# Patient Record
Sex: Male | Born: 1995 | Race: Black or African American | Hispanic: No | Marital: Single
Health system: Southern US, Community
[De-identification: ages and names within clinical notes are randomized; demographics above are authoritative.]

## PROBLEM LIST (undated history)

## (undated) DIAGNOSIS — K920 Hematemesis: Secondary | ICD-10-CM

## (undated) DIAGNOSIS — R569 Unspecified convulsions: Secondary | ICD-10-CM

## (undated) DIAGNOSIS — R109 Unspecified abdominal pain: Secondary | ICD-10-CM

## (undated) DIAGNOSIS — T7840XA Allergy, unspecified, initial encounter: Secondary | ICD-10-CM

## (undated) DIAGNOSIS — F1910 Other psychoactive substance abuse, uncomplicated: Secondary | ICD-10-CM

## (undated) DIAGNOSIS — F419 Anxiety disorder, unspecified: Secondary | ICD-10-CM

## (undated) HISTORY — PX: KNEE SURGERY: SHX244

## (undated) HISTORY — DX: Unspecified abdominal pain: R10.9

## (undated) HISTORY — DX: Hematemesis: K92.0

---
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
## 2015-08-01 DIAGNOSIS — F410 Panic disorder [episodic paroxysmal anxiety] without agoraphobia: Secondary | ICD-10-CM | POA: Diagnosis not present

## 2015-08-01 DIAGNOSIS — F331 Major depressive disorder, recurrent, moderate: Secondary | ICD-10-CM | POA: Diagnosis not present

## 2015-10-29 DIAGNOSIS — F410 Panic disorder [episodic paroxysmal anxiety] without agoraphobia: Secondary | ICD-10-CM | POA: Diagnosis not present

## 2015-10-29 DIAGNOSIS — F331 Major depressive disorder, recurrent, moderate: Secondary | ICD-10-CM | POA: Diagnosis not present

## 2015-12-05 ENCOUNTER — Emergency Department
Admission: EM | Admit: 2015-12-05 | Discharge: 2015-12-05 | Disposition: A | Discharge status: Home / Self Care | Payer: PRIVATE HEALTH INSURANCE

## 2015-12-05 ENCOUNTER — Inpatient Hospital Stay: Admit: 2015-12-05 | Payer: Self-pay

## 2015-12-05 ENCOUNTER — Encounter: Payer: Self-pay | Admitting: *Deleted

## 2015-12-05 ENCOUNTER — Emergency Department

## 2015-12-05 ENCOUNTER — Emergency Department
Admission: EM | Admit: 2015-12-05 | Discharge: 2015-12-06 | Attending: Emergency Medicine | Admitting: Emergency Medicine

## 2015-12-05 DIAGNOSIS — Y939 Activity, unspecified: Secondary | ICD-10-CM | POA: Diagnosis not present

## 2015-12-05 DIAGNOSIS — F0781 Postconcussional syndrome: Secondary | ICD-10-CM | POA: Insufficient documentation

## 2015-12-05 DIAGNOSIS — Y999 Unspecified external cause status: Secondary | ICD-10-CM | POA: Insufficient documentation

## 2015-12-05 DIAGNOSIS — S0990XA Unspecified injury of head, initial encounter: Secondary | ICD-10-CM | POA: Diagnosis present

## 2015-12-05 DIAGNOSIS — Y92149 Unspecified place in prison as the place of occurrence of the external cause: Secondary | ICD-10-CM | POA: Insufficient documentation

## 2015-12-05 DIAGNOSIS — W050XXA Fall from non-moving wheelchair, initial encounter: Secondary | ICD-10-CM | POA: Insufficient documentation

## 2015-12-05 DIAGNOSIS — R55 Syncope and collapse: Secondary | ICD-10-CM | POA: Insufficient documentation

## 2015-12-05 LAB — COMPREHENSIVE METABOLIC PANEL
ALBUMIN: 4.6 g/dL (ref 3.5–5.0)
ALK PHOS: 48 U/L (ref 38–126)
ALT: 21 U/L (ref 17–63)
ANION GAP: 6 (ref 5–15)
AST: 28 U/L (ref 15–41)
BILIRUBIN TOTAL: 0.6 mg/dL (ref 0.3–1.2)
BUN: 12 mg/dL (ref 6–20)
CALCIUM: 9.8 mg/dL (ref 8.9–10.3)
CO2: 27 mmol/L (ref 22–32)
CREATININE: 1.06 mg/dL (ref 0.61–1.24)
Chloride: 106 mmol/L (ref 101–111)
GFR calc non Af Amer: >60 mL/min (ref >60)
GLUCOSE: 122 mg/dL — AB (ref 65–99)
Potassium: 3.9 mmol/L (ref 3.5–5.1)
Sodium: 139 mmol/L (ref 135–145)
TOTAL PROTEIN: 7.5 g/dL (ref 6.5–8.1)

## 2015-12-05 LAB — SALICYLATE LEVEL: Salicylate Lvl: <4 mg/dL (ref 2.8–30.0)

## 2015-12-05 LAB — ETHANOL: Alcohol, Ethyl (B): <5 mg/dL (ref <5)

## 2015-12-05 LAB — URINE DRUG SCREEN, QUALITATIVE (ARMC ONLY)
Amphetamines, Ur Screen: NOT DETECTED
Barbiturates, Ur Screen: NOT DETECTED
Benzodiazepine, Ur Scrn: NOT DETECTED
Cannabinoid 50 Ng, Ur ~~LOC~~: POSITIVE — AB
Cocaine Metabolite,Ur ~~LOC~~: NOT DETECTED
MDMA (Ecstasy)Ur Screen: NOT DETECTED
METHADONE SCREEN, URINE: NOT DETECTED
OPIATE, UR SCREEN: NOT DETECTED
Phencyclidine (PCP) Ur S: NOT DETECTED
Tricyclic, Ur Screen: NOT DETECTED

## 2015-12-05 LAB — CBC
HEMATOCRIT: 42.7 % (ref 40.0–52.0)
Hemoglobin: 13.7 g/dL (ref 13.0–18.0)
MCH: 23.4 pg — AB (ref 26.0–34.0)
MCHC: 31.9 g/dL — AB (ref 32.0–36.0)
MCV: 73.3 fL — ABNORMAL LOW (ref 80.0–100.0)
Platelets: 203 10*3/uL (ref 150–440)
RBC: 5.83 MIL/uL (ref 4.40–5.90)
RDW: 13.9 % (ref 11.5–14.5)
WBC: 9.7 10*3/uL (ref 3.8–10.6)

## 2015-12-05 LAB — ACETAMINOPHEN LEVEL

## 2015-12-05 NOTE — ED Notes (Signed)
Pt brought in by [place] sheriff deputy handcuffed in a wheelchair from the jail.  Dr Maryruth Bunkapur also with pt.  Pt reports recent falls and dizzy spells. Pt had 3 falls total 1 fall 2 weeks ago and 2 falls 5 days ago.  Pt has n/v, dizziness.  Hx depression.  Pt alert.  Speech clear.

## 2015-12-05 NOTE — ED Notes (Signed)
Pt reports at approx 9 am this morning he passes out and hit back of head. Fall was witnessed. Pt reports immediately before passing out he experienced chest/abdominal pain, his vision became blurry and he fell to floor. Pt reports after LOC, he had 3 emeses of thick yellow liquid.

## 2015-12-06 MED ORDER — TRAZODONE HCL 100 MG PO TABS
100.0000 mg | ORAL_TABLET | Freq: Every day | ORAL | Status: DC
  Administered 2015-12-06: 100 mg via ORAL

## 2015-12-06 MED ORDER — TRAZODONE HCL 100 MG PO TABS
ORAL_TABLET | ORAL | Status: AC
  Filled 2015-12-06: qty 1

## 2015-12-06 MED ORDER — TRAZODONE HCL 100 MG PO TABS: 100.0000 mg | ORAL_TABLET | Freq: Every day | ORAL | Status: DC

## 2015-12-06 NOTE — Discharge Instructions (Signed)
Post-Concussion Syndrome Please Stop Taking Seroquel Post-concussion syndrome describes the symptoms that can occur after a head injury. These symptoms can last from weeks to months. CAUSES  It is not clear why some head injuries cause post-concussion syndrome. It can occur whether your head injury was mild or severe and whether you were wearing head protection or not.  SIGNS AND SYMPTOMS  Memory difficulties.  Dizziness.  Headaches.  Double vision or blurry vision.  Sensitivity to light.  Hearing difficulties.  Depression.  Tiredness.  Weakness.  Difficulty with concentration.  Difficulty sleeping or staying asleep.  Vomiting.  Poor balance or instability on your feet.  Slow reaction time.  Difficulty learning and remembering things you have heard. DIAGNOSIS  There is no test to determine whether you have post-concussion syndrome. Your health care provider may order an imaging scan of your brain, such as a CT scan, to check for other problems that may be causing your symptoms (such as a severe injury inside your skull). TREATMENT  Usually, these problems disappear over time without medical care. Your health care provider may prescribe medicine to help ease your symptoms. It is important to follow up with a neurologist to evaluate your recovery and address any lingering symptoms or issues. HOME CARE INSTRUCTIONS   Take medicines only as directed by your health care provider. Do not take aspirin. Aspirin can slow blood clotting.  Sleep with your head slightly elevated to help with headaches.  Avoid any situation where there is potential for another head injury. This includes football, hockey, soccer, basketball, martial arts, downhill snow sports, and horseback riding. Your condition will get worse every time you experience a concussion. You should avoid these activities until you are evaluated by the appropriate follow-up health care providers.  Keep all follow-up  visits as directed by your health care provider. This is important. SEEK MEDICAL CARE IF:  You have increased problems paying attention or concentrating.  You have increased difficulty remembering or learning new information.  You need more time to complete tasks or assignments than before.  You have increased irritability or decreased ability to cope with stress.  You have more symptoms than before. Seek medical care if you have any of the following symptoms for more than two weeks after your injury:  Lasting (chronic) headaches.  Dizziness or balance problems.  Nausea.  Vision problems.  Increased sensitivity to noise or light.  Depression or mood swings.  Anxiety or irritability.  Memory problems.  Difficulty concentrating or paying attention.  Sleep problems.  Feeling tired all the time. SEEK IMMEDIATE MEDICAL CARE IF:  You have confusion or unusual drowsiness.  Others find it difficult to wake you up.  You have nausea or persistent, forceful vomiting.  You feel like you are moving when you are not (vertigo). Your eyes may move rapidly back and forth.  You have convulsions or faint.  You have severe, persistent headaches that are not relieved by medicine.  You cannot use your arms or legs normally.  One of your pupils is larger than the other.  You have clear or bloody discharge from your nose or ears.  Your problems are getting worse, not better. MAKE SURE YOU:  Understand these instructions.  Will watch your condition.  Will get help right away if you are not doing well or get worse.   This information is not intended to replace advice given to you by your health care provider. Make sure you discuss any questions you have with your  health care provider.   Document Released: 12/06/2001 Document Revised: 07/07/2014 Document Reviewed: 09/21/2013 Elsevier Interactive Patient Education 2016 [employer]  Syncope Syncope is a medical term for  fainting or passing out. This means you lose consciousness and drop to the ground. People are generally unconscious for less than 5 minutes. You may have some muscle twitches for up to 15 seconds before waking up and returning to normal. Syncope occurs more often in older adults, but it can happen to anyone. While most causes of syncope are not dangerous, syncope can be a sign of a serious medical problem. It is important to seek medical care.  CAUSES  Syncope is caused by a sudden drop in blood flow to the brain. The specific cause is often not determined. Factors that can bring on syncope include:  Taking medicines that lower blood pressure.  Sudden changes in posture, such as standing up quickly.  Taking more medicine than prescribed.  Standing in one place for too long.  Seizure disorders.  Dehydration and excessive exposure to heat.  Low blood sugar (hypoglycemia).  Straining to have a bowel movement.  Heart disease, irregular heartbeat, or other circulatory problems.  Fear, emotional distress, seeing blood, or severe pain. SYMPTOMS  Right before fainting, you may:  Feel dizzy or light-headed.  Feel nauseous.  See all white or all black in your field of vision.  Have cold, clammy skin. DIAGNOSIS  Your health care provider will ask about your symptoms, perform a physical exam, and perform an electrocardiogram (ECG) to record the electrical activity of your heart. Your health care provider may also perform other heart or blood tests to determine the cause of your syncope which may include:  Transthoracic echocardiogram (TTE). During echocardiography, sound waves are used to evaluate how blood flows through your heart.  Transesophageal echocardiogram (TEE).  Cardiac monitoring. This allows your health care provider to monitor your heart rate and rhythm in real time.  Holter monitor. This is a portable device that records your heartbeat and can help diagnose heart  arrhythmias. It allows your health care provider to track your heart activity for several days, if needed.  Stress tests by exercise or by giving medicine that makes the heart beat faster. TREATMENT  In most cases, no treatment is needed. Depending on the cause of your syncope, your health care provider may recommend changing or stopping some of your medicines. HOME CARE INSTRUCTIONS  Have someone stay with you until you feel stable.  Do not drive, use machinery, or play sports until your health care provider says it is okay.  Keep all follow-up appointments as directed by your health care provider.  Lie down right away if you start feeling like you might faint. Breathe deeply and steadily. Wait until all the symptoms have passed.  Drink enough fluids to keep your urine clear or pale yellow.  If you are taking blood pressure or heart medicine, get up slowly and take several minutes to sit and then stand. This can reduce dizziness. SEEK IMMEDIATE MEDICAL CARE IF:   You have a severe headache.  You have unusual pain in the chest, abdomen, or back.  You are bleeding from your mouth or rectum, or you have black or tarry stool.  You have an irregular or very fast heartbeat.  You have pain with breathing.  You have repeated fainting or seizure-like jerking during an episode.  You faint when sitting or lying down.  You have confusion.  You have trouble walking.  You have severe weakness.  You have vision problems. If you fainted, call your local emergency services (911 in [place] Do not drive yourself to the hospital.    This information is not intended to replace advice given to you by your health care provider. Make sure you discuss any questions you have with your health care provider.   Document Released: 06/16/2005 Document Revised: 10/31/2014 Document Reviewed: 08/15/2011 Elsevier Interactive Patient Education [employer]

## 2015-12-06 NOTE — ED Notes (Signed)
Reviewed d/c instructions, follow-up care, prescription with pt and police officer. Pt/officer verbalized understanding

## 2015-12-06 NOTE — ED Provider Notes (Addendum)
[facility] Emergency Department Provider Note  ____________________________________________  Time seen: 12:00 AM  I have reviewed the triage vital signs and the nursing notes.   HISTORY  Chief Complaint Fall and Head Injury     HPI Timothy Cole is a 20 y.o. male [place] [employer] with history of 3 falls in the past 2 weeks. Patient stated he had a syncopal episode approximately 2 weeks ago with resultant head injury and since then has had nausea vomiting dizziness visual changes in his left eye and persistent headache. Patient subsequent only had 2 other falls 5 days ago with resultant head injury. Patient referred in by Dr. Adriana Simasook for with concerns that simple episode may be secondary to Seroquel and recommendation to discontinue Seroquel. Patient states that before the episodes she has increase in respirations chest and abdominal tightness and anxiety.   Past Medical History  Diagnosis Date  . Abdominal pain, recurrent   . Hematemesis   . Allergy     Dust    Patient Active Problem List   Diagnosis Date Noted  . Epigastric abdominal pain   . Hematemesis     Past Surgical History  Procedure Laterality Date  . Esophagogastroduodenoscopy  09/19/2011    Procedure: ESOPHAGOGASTRODUODENOSCOPY (EGD);  Surgeon: Jon GillsJoseph H Clark, MD;  [facility];  Service: Gastroenterology;  Laterality: N/A;    Current Outpatient Rx  Name  Route  Sig  Dispense  Refill  . EXPIRED: omeprazole (PRILOSEC) 40 MG capsule   Oral   Take 1 capsule (40 mg total) by mouth daily.   30 capsule   5     Allergies No known drug allergies  Family History  Problem Relation Age of Onset  . Cholelithiasis Maternal Aunt   . Ulcers Maternal Grandmother   . Birth defects Maternal Grandmother   . Diabetes Maternal Grandmother   . Diabetes Maternal Grandfather   . Heart disease Maternal Grandfather   . Hyperlipidemia Maternal Grandfather   . Hypertension  Maternal Grandfather     Social History Social History  Substance Use Topics  . Smoking status: Never Smoker   . Smokeless tobacco: Never Used  . Alcohol Use: No    Review of Systems  Constitutional: Negative for fever. Eyes: Negative for visual changes. ENT: Negative for sore throat. Cardiovascular: Negative for chest pain. Respiratory: Negative for shortness of breath. Gastrointestinal: Negative for abdominal pain, vomiting and diarrhea. Genitourinary: Negative for dysuria. Musculoskeletal: Negative for back pain. Skin: Negative for rash. Neurological: Negative for headaches, focal weakness or numbness. Psychiatric:Positive for anxiety  10-point ROS otherwise negative.  ____________________________________________   PHYSICAL EXAM:  VITAL SIGNS: ED Triage Vitals  Enc Vitals Group     BP 12/05/15 2035 121/80 mmHg     Pulse Rate 12/05/15 2035 69     Resp 12/05/15 2035 18     Temp 12/05/15 2035 97.6 F (36.4 C)     Temp Source 12/05/15 2035 Oral     SpO2 12/05/15 2035 99 %     Weight 12/05/15 2035 170 lb (77.111 kg)     Height 12/05/15 2035 5\' 11"  (1.803 m)     Head Cir --      Peak Flow --      Pain Score 12/05/15 2036 8     Pain Loc --      Pain Edu? --      Excl. in GC? --      Constitutional: Alert and oriented. Well appearing  and in no distress. Eyes: Conjunctivae are normal. PERRL. Normal extraocular movements. ENT   Head: Normocephalic and atraumatic.   Nose: No congestion/rhinnorhea.   Mouth/Throat: Mucous membranes are moist.   Neck: No stridor. Hematological/Lymphatic/Immunilogical: No cervical lymphadenopathy. Cardiovascular: Normal rate, regular rhythm. Normal and symmetric distal pulses are present in all extremities. No murmurs, rubs, or gallops. Respiratory: Normal respiratory effort without tachypnea nor retractions. Breath sounds are clear and equal bilaterally. No wheezes/rales/rhonchi. Gastrointestinal: Soft and nontender. No  distention. There is no CVA tenderness. Genitourinary: deferred Musculoskeletal: Nontender with normal range of motion in all extremities. No joint effusions.  No lower extremity tenderness nor edema. Neurologic:  Normal speech and language. No gross focal neurologic deficits are appreciated. Speech is normal.  Skin:  Skin is warm, dry and intact. No rash noted. Psychiatric: Mood and affect are normal. Speech and behavior are normal. Patient exhibits appropriate insight and judgment.  ____________________________________________    LABS (pertinent positives/negatives)  Labs Reviewed  COMPREHENSIVE METABOLIC PANEL - Abnormal; Notable for the following:    Glucose, Bld 122 (*)    All other components within normal limits  ACETAMINOPHEN LEVEL - Abnormal; Notable for the following:    Acetaminophen (Tylenol), Serum <10 (*)    All other components within normal limits  CBC - Abnormal; Notable for the following:    MCV 73.3 (*)    MCH 23.4 (*)    MCHC 31.9 (*)    All other components within normal limits  URINE DRUG SCREEN, QUALITATIVE ([facility] ONLY) - Abnormal; Notable for the following:    Cannabinoid 50 Ng, Ur Tye POSITIVE (*)    All other components within normal limits  ETHANOL  SALICYLATE LEVEL    ____________________________________________   EKG ED ECG REPORT I, [place] N BROWN, the attending physician, personally viewed and interpreted this ECG.   Date: 12/19/2015  EKG Time: 12:21 AM  Rate: 57  Rhythm: Normal sinus rhythm  Axis: Normal  Intervals: Normal  ST&T Change: None    RADIOLOGY CT Head Wo Contrast (Final result) Result time: 12/05/15 21:17:18   Final result by Rad Results In Interface (12/05/15 21:17:18)   Narrative:   CLINICAL DATA: Dizziness and falls.  EXAM: CT HEAD WITHOUT CONTRAST  TECHNIQUE: Contiguous axial images were obtained from the base of the skull through the vertex without intravenous contrast.  COMPARISON:  08/23/2012  FINDINGS: The brain demonstrates no evidence of hemorrhage, infarction, edema, mass effect, extra-axial fluid collection, hydrocephalus or mass lesion. The skull is unremarkable.  IMPRESSION: Normal head CT.   Electronically Signed By: Irish Lack M.D. On: 12/05/2015 21:17          INITIAL IMPRESSION / ASSESSMENT AND PLAN / ED COURSE  Pertinent labs & imaging results that were available during my care of the patient were reviewed by me and considered in my medical decision making (see chart for details).  History physical exam consistent with Syncope possibly secondary to medication/panic attack. However it is also very likely that the patient is expressing postconcussive syndrome from the initial head injury 2 weeks ago  ____________________________________________   FINAL CLINICAL IMPRESSION(S) / ED DIAGNOSES  Final diagnoses:  Postconcussive syndrome  Syncope, unspecified syncope type      Darci Current, MD 12/06/15 1610  Darci Current, MD 12/19/15 2250

## 2016-01-31 DIAGNOSIS — L408 Other psoriasis: Secondary | ICD-10-CM | POA: Diagnosis not present

## 2016-04-14 DIAGNOSIS — F410 Panic disorder [episodic paroxysmal anxiety] without agoraphobia: Secondary | ICD-10-CM | POA: Diagnosis not present

## 2016-04-14 DIAGNOSIS — F331 Major depressive disorder, recurrent, moderate: Secondary | ICD-10-CM | POA: Diagnosis not present

## 2016-05-06 DIAGNOSIS — F331 Major depressive disorder, recurrent, moderate: Secondary | ICD-10-CM | POA: Diagnosis not present

## 2016-05-06 DIAGNOSIS — F410 Panic disorder [episodic paroxysmal anxiety] without agoraphobia: Secondary | ICD-10-CM | POA: Diagnosis not present

## 2016-05-26 ENCOUNTER — Ambulatory Visit
Admission: EM | Admit: 2016-05-26 | Discharge: 2016-05-26 | Disposition: A | Discharge status: Home / Self Care | Payer: 59 | Attending: Family Medicine | Admitting: Family Medicine

## 2016-05-26 DIAGNOSIS — J020 Streptococcal pharyngitis: Secondary | ICD-10-CM | POA: Diagnosis not present

## 2016-05-26 LAB — RAPID STREP SCREEN (MED CTR MEBANE ONLY): STREPTOCOCCUS, GROUP A SCREEN (DIRECT): POSITIVE — AB

## 2016-05-26 MED ORDER — AMOXICILLIN 400 MG/5ML PO SUSR: 880.0000 mg | Freq: Two times a day (BID) | ORAL | 0 refills | Status: AC

## 2016-05-26 MED ORDER — DEXAMETHASONE SODIUM PHOSPHATE 10 MG/ML IJ SOLN
10.0000 mg | Freq: Once | INTRAMUSCULAR | Status: AC
  Administered 2016-05-26: 10 mg via INTRAMUSCULAR

## 2016-05-26 NOTE — Discharge Instructions (Signed)
Take medication as prescribed. Rest. Drink plenty of fluids.  ° °Follow up with your primary care physician this week as needed. Return to Urgent care for new or worsening concerns.  ° °

## 2016-05-26 NOTE — ED Notes (Signed)
Patient waited 15 minutes and no reactions were noted. Injection site is unremarkable. 

## 2016-05-26 NOTE — ED Triage Notes (Signed)
Pt c/o sore throat for about 4 days, and he has a headache and back aches.

## 2016-05-26 NOTE — ED Provider Notes (Signed)
[facility] ____________________________________________  Time seen: Approximately 3:37 PM  I have reviewed the triage vital signs and the nursing notes.   HISTORY  Chief Complaint Sore Throat   HPI Timothy Cole is a 20 y.o. male presents with complaints of 4 days of sore throat. Also reports some generalized body aches and headaches. Denies runny nose, nasal congestion or cough. Denies known fevers. Denies insect contacts. Reports some improvement with over-the-counter ibuprofen. Reports hurts to swallow and eat but continues to eat and drink overall well. Denies recent sickness or recent antibiotic use. Denies other complaints.    Past Medical History:  Diagnosis Date  . Abdominal pain, recurrent   . Allergy    Dust  . Hematemesis     Patient Active Problem List   Diagnosis Date Noted  . Epigastric abdominal pain   . Hematemesis     Past Surgical History:  Procedure Laterality Date  . ESOPHAGOGASTRODUODENOSCOPY  09/19/2011   Procedure: ESOPHAGOGASTRODUODENOSCOPY (EGD);  Surgeon: Jon GillsJoseph H Clark, MD;  [facility];  Service: Gastroenterology;  Laterality: N/A;      No current facility-administered medications for this encounter.   Current Outpatient Prescriptions:  .  amoxicillin (AMOXIL) 400 MG/5ML suspension, Take 11 mLs (880 mg total) by mouth 2 (two) times daily., Disp: 220 mL, Rfl: 0  Allergies Shellfish allergy  Family History  Problem Relation Age of Onset  . Cholelithiasis Maternal Aunt   . Ulcers Maternal Grandmother   . Birth defects Maternal Grandmother   . Diabetes Maternal Grandmother   . Diabetes Maternal Grandfather   . Heart disease Maternal Grandfather   . Hyperlipidemia Maternal Grandfather   . Hypertension Maternal Grandfather     Social History Social History  Substance Use Topics  . Smoking status: Never Smoker  . Smokeless tobacco: Never Used  . Alcohol use No    Review of Systems Constitutional: No  fever/chills Eyes: No visual changes. ENT: positive sore throat. Cardiovascular: Denies chest pain. Respiratory: Denies shortness of breath. Gastrointestinal: No abdominal pain.  No nausea, no vomiting.  No diarrhea.  No constipation. Genitourinary: Negative for dysuria. Musculoskeletal: Negative for back pain. Skin: Negative for rash. Neurological: Negative for headaches, focal weakness or numbness.  10-point ROS otherwise negative.  ____________________________________________   PHYSICAL EXAM:  VITAL SIGNS: ED Triage Vitals  Enc Vitals Group     BP 05/26/16 1457 117/72     Pulse Rate 05/26/16 1457 93     Resp 05/26/16 1457 18     Temp 05/26/16 1457 99.2 F (37.3 C)     Temp Source 05/26/16 1457 Oral     SpO2 05/26/16 1457 99 %     Weight 05/26/16 1458 170 lb (77.1 kg)     Height 05/26/16 1458 5\' 10"  (1.778 m)     Head Circumference --      Peak Flow --      Pain Score 05/26/16 1459 10     Pain Loc --      Pain Edu? --      Excl. in GC? --     Constitutional: Alert and oriented. Well appearing and in no acute distress. Eyes: Conjunctivae are normal. PERRL. EOMI. ENT      Head: Normocephalic and atraumatic.      Ears: no erythema, normal TM bilaterally.       Nose: No congestion/rhinnorhea.      Mouth/Throat: Mucous membranes are moist. Moderate pharyngeal erythema, 2+ bilateral tonsillar swelling. No exudate. No uvular shift  or deviation.  Neck: No stridor. Supple without meningismus.  Hematological/Lymphatic/Immunilogical: Mild anterior bilateral cervical lymphadenopathy. Cardiovascular: Normal rate, regular rhythm. Grossly normal heart sounds.  Good peripheral circulation. Respiratory: Normal respiratory effort without tachypnea nor retractions. Breath sounds are clear and equal bilaterally. No wheezes/rales/rhonchi.. Gastrointestinal: Soft and nontender. No distention. No hepatosplenomegaly palpated.  Musculoskeletal:  Nontender with normal range of motion in all  extremities. No midline cervical, thoracic or lumbar tenderness to palpation.  Neurologic:  Normal speech and language. No gross focal neurologic deficits are appreciated. Speech is normal. No gait instability.  Skin:  Skin is warm, dry and intact. No rash noted. Psychiatric: Mood and affect are normal. Speech and behavior are normal. Patient exhibits appropriate insight and judgment   ___________________________________________   LABS (all labs ordered are listed, but only abnormal results are displayed)  Labs Reviewed  RAPID STREP SCREEN (NOT AT [facility]) - Abnormal; Notable for the following:       Result Value   Streptococcus, Group A Screen (Direct) POSITIVE (*)    All other components within normal limits     PROCEDURES Procedures    INITIAL IMPRESSION / ASSESSMENT AND PLAN / ED COURSE  Pertinent labs & imaging results that were available during my care of the patient were reviewed by me and considered in my medical decision making (see chart for details).  Well-appearing patient. No acute distress. Strep positive. Discussed options of treatment with patient. Patient opts for oral liquid antibiotic, and will give patient 10 mg IM Decadron once in urgent care. Encouraged rest, fluids and supportive care.Discussed indication, risks and benefits of medications with patient.  Discussed follow up with Primary care physician this week. Discussed follow up and return parameters including no resolution or any worsening concerns. Patient verbalized understanding and agreed to plan.   ____________________________________________   FINAL CLINICAL IMPRESSION(S) / ED DIAGNOSES  Final diagnoses:  Strep pharyngitis     Discharge Medication List as of 05/26/2016  3:58 PM    START taking these medications   Details  amoxicillin (AMOXIL) 400 MG/5ML suspension Take 11 mLs (880 mg total) by mouth 2 (two) times daily., Starting Mon 05/26/2016, Until Thu 06/05/2016, Normal        Note:  This dictation was prepared with Dragon dictation along with smaller phrase technology. Any transcriptional errors that result from this process are unintentional.    Clinical Course       Renford DillsLindsey Ahmiyah Coil, NP 05/26/16 1631

## 2016-06-11 DIAGNOSIS — F410 Panic disorder [episodic paroxysmal anxiety] without agoraphobia: Secondary | ICD-10-CM | POA: Diagnosis not present

## 2016-06-11 DIAGNOSIS — F331 Major depressive disorder, recurrent, moderate: Secondary | ICD-10-CM | POA: Diagnosis not present

## 2016-07-29 ENCOUNTER — Ambulatory Visit
Admission: EM | Admit: 2016-07-29 | Discharge: 2016-07-29 | Disposition: A | Discharge status: Home / Self Care | Payer: 59 | Attending: Family Medicine | Admitting: Family Medicine

## 2016-07-29 DIAGNOSIS — S39012A Strain of muscle, fascia and tendon of lower back, initial encounter: Secondary | ICD-10-CM

## 2016-07-29 DIAGNOSIS — S161XXA Strain of muscle, fascia and tendon at neck level, initial encounter: Secondary | ICD-10-CM | POA: Diagnosis not present

## 2016-07-29 MED ORDER — CYCLOBENZAPRINE HCL 10 MG PO TABS: 10.0000 mg | ORAL_TABLET | Freq: Three times a day (TID) | ORAL | 0 refills | Status: DC | PRN

## 2016-07-29 NOTE — ED Triage Notes (Signed)
Pt c/o back pain in his neck. It is also painful to walk. He denies and trauma at all.

## 2016-07-29 NOTE — ED Provider Notes (Signed)
[facility]    CSN: [id number] Arrival date & time: 07/29/16  1253     History   Chief Complaint Chief Complaint  Patient presents with  . Back Pain    HPI Timothy Cole is a 21 y.o. male.   The history is provided by the patient.  Back Pain  Location:  Lumbar spine (upper back) Quality:  Aching Radiates to:  Does not radiate Pain severity:  Mild Pain is:  Same all the time Onset quality:  Sudden Timing:  Constant Progression:  Worsening Chronicity:  New Context: not emotional stress, not falling, not jumping from heights, not lifting heavy objects, not MCA, not MVA, not occupational injury, not pedestrian accident, not physical stress, not recent illness, not recent injury and not twisting   Context comment:  Denies any injuries, trauma, heavy lifting Relieved by:  None tried Worsened by:  Twisting and bending Associated symptoms: no abdominal pain, no abdominal swelling, no bladder incontinence, no bowel incontinence, no chest pain, no dysuria, no fever, no headaches, no leg pain, no numbness, no paresthesias, no pelvic pain, no perianal numbness, no tingling, no weakness and no weight loss     Past Medical History:  Diagnosis Date  . Abdominal pain, recurrent   . Allergy    Dust  . Hematemesis     Patient Active Problem List   Diagnosis Date Noted  . Epigastric abdominal pain   . Hematemesis     Past Surgical History:  Procedure Laterality Date  . ESOPHAGOGASTRODUODENOSCOPY  09/19/2011   Procedure: ESOPHAGOGASTRODUODENOSCOPY (EGD);  Surgeon: Jon Gills, MD;  Location: [facility] OR;  Service: Gastroenterology;  Laterality: N/A;       Home Medications    Prior to Admission medications   Medication Sig Start Date End Date Taking? Authorizing Provider  citalopram (CELEXA) 10 MG tablet Take 10 mg by mouth daily.   Yes Historical Provider, MD  clonazePAM (KLONOPIN) 0.5 MG tablet Take 0.5 mg by mouth 2 (two) times daily as needed for anxiety.    Yes Historical Provider, MD  cyclobenzaprine (FLEXERIL) 10 MG tablet Take 1 tablet (10 mg total) by mouth 3 (three) times daily as needed for muscle spasms. 07/29/16   Payton Mccallum, MD    Family History Family History  Problem Relation Age of Onset  . Cholelithiasis Maternal Aunt   . Ulcers Maternal Grandmother   . Birth defects Maternal Grandmother   . Diabetes Maternal Grandmother   . Diabetes Maternal Grandfather   . Heart disease Maternal Grandfather   . Hyperlipidemia Maternal Grandfather   . Hypertension Maternal Grandfather     Social History Social History  Substance Use Topics  . Smoking status: Never Smoker  . Smokeless tobacco: Never Used  . Alcohol use No     Allergies   Shellfish allergy   Review of Systems Review of Systems  Constitutional: Negative for fever and weight loss.  Cardiovascular: Negative for chest pain.  Gastrointestinal: Negative for abdominal pain and bowel incontinence.  Genitourinary: Negative for bladder incontinence, dysuria and pelvic pain.  Musculoskeletal: Positive for back pain.  Neurological: Negative for tingling, weakness, numbness, headaches and paresthesias.     Physical Exam Triage Vital Signs ED Triage Vitals  Enc Vitals Group     BP 07/29/16 1304 125/64     Pulse Rate 07/29/16 1304 72     Resp --      Temp 07/29/16 1304 97.8 F (36.6 C)     Temp Source 07/29/16 1304  Oral     SpO2 07/29/16 1304 99 %     Weight 07/29/16 1302 170 lb (77.1 kg)     Height 07/29/16 1302 5\' 10"  (1.778 m)     Head Circumference --      Peak Flow --      Pain Score 07/29/16 1303 10     Pain Loc --      Pain Edu? --      Excl. in GC? --    No data found.   Updated Vital Signs BP 125/64 (BP Location: Left Arm)   Pulse 72   Temp 97.8 F (36.6 C) (Oral)   Ht 5\' 10"  (1.778 m)   Wt 170 lb (77.1 kg)   SpO2 99%   BMI 24.39 kg/m   Visual Acuity Right Eye Distance:   Left Eye Distance:   Bilateral Distance:    Right Eye Near:     Left Eye Near:    Bilateral Near:     Physical Exam  Constitutional: He appears well-developed and well-nourished. No distress.  Musculoskeletal:       Cervical back: He exhibits tenderness (cervical paraspinous muscles and trapezius) and spasm. He exhibits normal range of motion, no bony tenderness, no swelling, no edema, no deformity, no laceration, no pain and normal pulse.       Lumbar back: He exhibits tenderness (lumbar paraspinous muscles) and spasm. He exhibits normal range of motion, no bony tenderness, no swelling, no edema, no deformity, no laceration, no pain and normal pulse.  Skin: He is not diaphoretic.  Nursing note and vitals reviewed.    UC Treatments / Results  Labs (all labs ordered are listed, but only abnormal results are displayed) Labs Reviewed - No data to display  EKG  EKG Interpretation None       Radiology No results found.  Procedures Procedures (including critical care time)  Medications Ordered in UC Medications - No data to display   Initial Impression / Assessment and Plan / UC Course  I have reviewed the triage vital signs and the nursing notes.  Pertinent labs & imaging results that were available during my care of the patient were reviewed by me and considered in my medical decision making (see chart for details).     Final Clinical Impressions(s) / UC Diagnoses   Final diagnoses:  Strain of lumbar region, initial encounter  Strain of neck muscle, initial encounter    New Prescriptions Discharge Medication List as of 07/29/2016  2:07 PM    START taking these medications   Details  cyclobenzaprine (FLEXERIL) 10 MG tablet Take 1 tablet (10 mg total) by mouth 3 (three) times daily as needed for muscle spasms., Starting Tue 07/29/2016, Normal       1. diagnosis reviewed with patient 2. rx as per orders above; reviewed possible side effects, interactions, risks and benefits  3. Recommend supportive treatment with otc  analgesics/NSAIDS, heat, gentle stretching 4. Follow-up prn if symptoms worsen or don't improve   Payton Mccallumrlando Victoriah Wilds, MD 07/29/16 1428

## 2016-11-10 ENCOUNTER — Encounter: Payer: Self-pay | Admitting: Emergency Medicine

## 2016-11-10 ENCOUNTER — Emergency Department: Payer: 59

## 2016-11-10 DIAGNOSIS — Y939 Activity, unspecified: Secondary | ICD-10-CM | POA: Diagnosis not present

## 2016-11-10 DIAGNOSIS — Y999 Unspecified external cause status: Secondary | ICD-10-CM | POA: Insufficient documentation

## 2016-11-10 DIAGNOSIS — S99922A Unspecified injury of left foot, initial encounter: Secondary | ICD-10-CM | POA: Diagnosis not present

## 2016-11-10 DIAGNOSIS — R52 Pain, unspecified: Secondary | ICD-10-CM | POA: Diagnosis not present

## 2016-11-10 DIAGNOSIS — S022XXA Fracture of nasal bones, initial encounter for closed fracture: Secondary | ICD-10-CM | POA: Insufficient documentation

## 2016-11-10 DIAGNOSIS — M25562 Pain in left knee: Secondary | ICD-10-CM | POA: Diagnosis not present

## 2016-11-10 DIAGNOSIS — R51 Headache: Secondary | ICD-10-CM | POA: Insufficient documentation

## 2016-11-10 DIAGNOSIS — Y92410 Unspecified street and highway as the place of occurrence of the external cause: Secondary | ICD-10-CM | POA: Diagnosis not present

## 2016-11-10 DIAGNOSIS — S39012A Strain of muscle, fascia and tendon of lower back, initial encounter: Secondary | ICD-10-CM | POA: Diagnosis not present

## 2016-11-10 DIAGNOSIS — S161XXA Strain of muscle, fascia and tendon at neck level, initial encounter: Secondary | ICD-10-CM | POA: Insufficient documentation

## 2016-11-10 DIAGNOSIS — S8992XA Unspecified injury of left lower leg, initial encounter: Secondary | ICD-10-CM | POA: Diagnosis not present

## 2016-11-10 DIAGNOSIS — S99912A Unspecified injury of left ankle, initial encounter: Secondary | ICD-10-CM | POA: Diagnosis not present

## 2016-11-10 DIAGNOSIS — S0993XA Unspecified injury of face, initial encounter: Secondary | ICD-10-CM | POA: Diagnosis present
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
## 2016-11-11 ENCOUNTER — Emergency Department: Payer: 59

## 2016-11-11 ENCOUNTER — Emergency Department
Admission: EM | Admit: 2016-11-11 | Discharge: 2016-11-11 | Disposition: A | Discharge status: Home / Self Care | Payer: 59 | Attending: Emergency Medicine | Admitting: Emergency Medicine

## 2016-11-11 DIAGNOSIS — S161XXA Strain of muscle, fascia and tendon at neck level, initial encounter: Secondary | ICD-10-CM

## 2016-11-11 DIAGNOSIS — S39012A Strain of muscle, fascia and tendon of lower back, initial encounter: Secondary | ICD-10-CM | POA: Diagnosis not present

## 2016-11-11 DIAGNOSIS — S022XXA Fracture of nasal bones, initial encounter for closed fracture: Secondary | ICD-10-CM

## 2016-11-11 DIAGNOSIS — M7918 Myalgia, other site: Secondary | ICD-10-CM

## 2016-11-11 DIAGNOSIS — M25562 Pain in left knee: Secondary | ICD-10-CM

## 2016-11-11 DIAGNOSIS — R51 Headache: Secondary | ICD-10-CM | POA: Diagnosis not present

## 2016-11-11 DIAGNOSIS — M542 Cervicalgia: Secondary | ICD-10-CM | POA: Diagnosis not present

## 2016-11-11 DIAGNOSIS — S199XXA Unspecified injury of neck, initial encounter: Secondary | ICD-10-CM | POA: Diagnosis not present

## 2016-11-11 MED ORDER — IBUPROFEN 800 MG PO TABS: 800.0000 mg | ORAL_TABLET | Freq: Three times a day (TID) | ORAL | 0 refills | Status: DC | PRN

## 2016-11-11 MED ORDER — KETOROLAC TROMETHAMINE 60 MG/2ML IM SOLN
60.0000 mg | Freq: Once | INTRAMUSCULAR | Status: AC
  Administered 2016-11-11: 60 mg via INTRAMUSCULAR

## 2016-11-11 MED ORDER — TRAMADOL HCL 50 MG PO TABS: 50.0000 mg | ORAL_TABLET | Freq: Four times a day (QID) | ORAL | 0 refills | Status: DC | PRN

## 2016-11-11 MED ORDER — TRAMADOL HCL 50 MG PO TABS
50.0000 mg | ORAL_TABLET | Freq: Once | ORAL | Status: AC
  Administered 2016-11-11: 50 mg via ORAL
  Filled 2016-11-11: qty 1

## 2016-11-11 MED ORDER — DIAZEPAM 2 MG PO TABS: 2.0000 mg | ORAL_TABLET | Freq: Three times a day (TID) | ORAL | 0 refills | Status: AC | PRN

## 2016-11-11 MED ORDER — KETOROLAC TROMETHAMINE 30 MG/ML IJ SOLN
INTRAMUSCULAR | Status: AC
  Administered 2016-11-11: 60 mg via INTRAMUSCULAR
  Filled 2016-11-11: qty 2
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
## 2016-11-11 NOTE — Discharge Instructions (Signed)
Please follow up. Please take your medications. Please return with any worsened conditions
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
## 2016-11-19 DIAGNOSIS — S022XXA Fracture of nasal bones, initial encounter for closed fracture: Secondary | ICD-10-CM | POA: Diagnosis not present

## 2016-11-19 DIAGNOSIS — H6123 Impacted cerumen, bilateral: Secondary | ICD-10-CM | POA: Diagnosis not present

## 2017-01-22 ENCOUNTER — Encounter: Payer: Self-pay | Admitting: Emergency Medicine

## 2017-01-22 ENCOUNTER — Emergency Department
Admission: EM | Admit: 2017-01-22 | Discharge: 2017-01-23 | Disposition: A | Discharge status: Home / Self Care | Payer: 59 | Attending: Emergency Medicine | Admitting: Emergency Medicine

## 2017-01-22 DIAGNOSIS — R197 Diarrhea, unspecified: Secondary | ICD-10-CM | POA: Insufficient documentation

## 2017-01-22 DIAGNOSIS — R112 Nausea with vomiting, unspecified: Secondary | ICD-10-CM | POA: Diagnosis not present

## 2017-01-22 DIAGNOSIS — R1010 Upper abdominal pain, unspecified: Secondary | ICD-10-CM | POA: Diagnosis not present

## 2017-01-22 DIAGNOSIS — R1012 Left upper quadrant pain: Secondary | ICD-10-CM | POA: Insufficient documentation

## 2017-01-22 DIAGNOSIS — Z79899 Other long term (current) drug therapy: Secondary | ICD-10-CM | POA: Insufficient documentation

## 2017-01-22 HISTORY — DX: Anxiety disorder, unspecified: F41.9

## 2017-01-22 LAB — CBC
HCT: 39.8 % — ABNORMAL LOW (ref 40.0–52.0)
Hemoglobin: 13.1 g/dL (ref 13.0–18.0)
MCH: 24 pg — ABNORMAL LOW (ref 26.0–34.0)
MCHC: 32.8 g/dL (ref 32.0–36.0)
MCV: 73.2 fL — ABNORMAL LOW (ref 80.0–100.0)
PLATELETS: 185 10*3/uL (ref 150–440)
RBC: 5.44 MIL/uL (ref 4.40–5.90)
RDW: 14.3 % (ref 11.5–14.5)
WBC: 12.5 10*3/uL — AB (ref 3.8–10.6)

## 2017-01-22 LAB — COMPREHENSIVE METABOLIC PANEL
ALT: 11 U/L — ABNORMAL LOW (ref 17–63)
AST: 20 U/L (ref 15–41)
Albumin: 4.7 g/dL (ref 3.5–5.0)
Alkaline Phosphatase: 44 U/L (ref 38–126)
Anion gap: 8 (ref 5–15)
BUN: 14 mg/dL (ref 6–20)
CHLORIDE: 104 mmol/L (ref 101–111)
CO2: 28 mmol/L (ref 22–32)
CREATININE: 1.12 mg/dL (ref 0.61–1.24)
Calcium: 9.8 mg/dL (ref 8.9–10.3)
GFR calc Af Amer: >60 mL/min (ref >60)
GFR calc non Af Amer: >60 mL/min (ref >60)
GLUCOSE: 106 mg/dL — AB (ref 65–99)
Potassium: 3.4 mmol/L — ABNORMAL LOW (ref 3.5–5.1)
SODIUM: 140 mmol/L (ref 135–145)
Total Bilirubin: 1.2 mg/dL (ref 0.3–1.2)
Total Protein: 7.9 g/dL (ref 6.5–8.1)

## 2017-01-22 LAB — URINALYSIS, COMPLETE (UACMP) WITH MICROSCOPIC
BILIRUBIN URINE: NEGATIVE
Bacteria, UA: NONE SEEN
GLUCOSE, UA: NEGATIVE mg/dL
Hgb urine dipstick: NEGATIVE
Ketones, ur: 5 mg/dL — AB
LEUKOCYTES UA: NEGATIVE
Nitrite: NEGATIVE
PH: 5 (ref 5.0–8.0)
Protein, ur: 30 mg/dL — AB
SPECIFIC GRAVITY, URINE: 1.039 — AB (ref 1.005–1.030)
WBC, UA: NONE SEEN WBC/hpf (ref 0–5)

## 2017-01-22 LAB — LIPASE, BLOOD: LIPASE: 21 U/L (ref 11–51)

## 2017-01-22 LAB — TROPONIN I: Troponin I: <0.03 ng/mL (ref <0.03)

## 2017-01-22 NOTE — ED Triage Notes (Signed)
Pt to ED from home c/o LUQ abd pain x1 week with n/v/d and fevers at home.  States blood in vomit, >10 episodes emesis today.  States has not had pain like this before.  A&Ox4, skin warm and dry, chest rise even and unlabored.

## 2017-01-23 MED ORDER — ONDANSETRON HCL 4 MG PO TABS
4.0000 mg | ORAL_TABLET | Freq: Once | ORAL | Status: AC
  Administered 2017-01-23: 4 mg via ORAL
  Filled 2017-01-23: qty 1

## 2017-01-23 MED ORDER — GI COCKTAIL ~~LOC~~
30.0000 mL | Freq: Once | ORAL | Status: AC
  Administered 2017-01-23: 30 mL via ORAL
  Filled 2017-01-23: qty 30

## 2017-01-23 MED ORDER — ONDANSETRON HCL 4 MG PO TABS: 4.0000 mg | ORAL_TABLET | Freq: Every day | ORAL | 0 refills | Status: DC | PRN

## 2017-01-23 MED ORDER — FAMOTIDINE 40 MG PO TABS: 40.0000 mg | ORAL_TABLET | Freq: Every evening | ORAL | 0 refills | Status: AC

## 2017-01-23 NOTE — ED Provider Notes (Signed)
[facility] Emergency Department Provider Note  ____________________________________________   First MD Initiated Contact with Patient 01/22/17 2348     (approximate)  I have reviewed the triage vital signs and the nursing notes.   HISTORY  Chief Complaint Abdominal Pain   HPI Timothy Cole is a 21 y.o. male with a history of anxiety who is presenting to the emergency department with left upper quadrant abdominal pain. He says that he has had nausea and vomiting over the past week as well as diarrhea. He says the pain is a 10 out of 10 to the left upper quadrant at this time and denies any lower abdominal pain. Says that he has had dark blood in his vomitus ever since this past Monday. Says that he has been vomiting too many times per day to remember as well as to many episodes of diarrhea per day. He denies any recent travel or antibiotics. Denies any known sick contacts. Denies any radiation of the pain.Denies any blood in his stool.   Past Medical History:  Diagnosis Date  . Abdominal pain, recurrent   . Allergy    Dust  . Anxiety   . Hematemesis     Patient Active Problem List   Diagnosis Date Noted  . Epigastric abdominal pain   . Hematemesis     Past Surgical History:  Procedure Laterality Date  . ESOPHAGOGASTRODUODENOSCOPY  09/19/2011   Procedure: ESOPHAGOGASTRODUODENOSCOPY (EGD);  Surgeon: Jon GillsJoseph H Clark, MD;  [facility];  Service: Gastroenterology;  Laterality: N/A;  . KNEE SURGERY Left     Prior to Admission medications   Medication Sig Start Date End Date Taking? Authorizing Provider  citalopram (CELEXA) 10 MG tablet Take 10 mg by mouth daily.    [provider]  clonazePAM (KLONOPIN) 0.5 MG tablet Take 0.5 mg by mouth 2 (two) times daily as needed for anxiety.    [provider]  cyclobenzaprine (FLEXERIL) 10 MG tablet Take 1 tablet (10 mg total) by mouth 3 (three) times daily as needed for muscle spasms.  07/29/16   Payton Mccallumonty, [place], MD  diazepam (VALIUM) 2 MG tablet Take 1 tablet (2 mg total) by mouth every 8 (eight) hours as needed for anxiety. 11/11/16 11/11/17  Rebecka ApleyWebster, Allison P, MD  ibuprofen (ADVIL,MOTRIN) 800 MG tablet Take 1 tablet (800 mg total) by mouth every 8 (eight) hours as needed. 11/11/16   Rebecka ApleyWebster, Allison P, MD  traMADol (ULTRAM) 50 MG tablet Take 1 tablet (50 mg total) by mouth every 6 (six) hours as needed. 11/11/16   Rebecka ApleyWebster, Allison P, MD    Allergies Shellfish allergy  Family History  Problem Relation Age of Onset  . Cholelithiasis Maternal Aunt   . Ulcers Maternal Grandmother   . Birth defects Maternal Grandmother   . Diabetes Maternal Grandmother   . Diabetes Maternal Grandfather   . Heart disease Maternal Grandfather   . Hyperlipidemia Maternal Grandfather   . Hypertension Maternal Grandfather     Social History Social History  Substance Use Topics  . Smoking status: Never Smoker  . Smokeless tobacco: Never Used  . Alcohol use No    Review of Systems  Constitutional: No fever/chills Eyes: No visual changes. ENT: No sore throat. Cardiovascular: Denies chest pain. Respiratory: Denies shortness of breath. Gastrointestinal:  No constipation. Genitourinary: Negative for dysuria. Musculoskeletal: Negative for back pain. Skin: Negative for rash. Neurological: Negative for headaches, focal weakness or numbness.   ____________________________________________   PHYSICAL EXAM:  VITAL SIGNS: ED  Triage Vitals  Enc Vitals Group     BP 01/22/17 2159 134/82     Pulse Rate 01/22/17 2159 60     Resp 01/22/17 2159 16     Temp 01/22/17 2159 98.7 F (37.1 C)     Temp Source 01/22/17 2159 Oral     SpO2 01/22/17 2159 100 %     Weight 01/22/17 2200 150 lb (68 kg)     Height 01/22/17 2200 5\' 10"  (1.778 m)     Head Circumference --      Peak Flow --      Pain Score 01/22/17 2200 8     Pain Loc --      Pain Edu? --      Excl. in GC? --     Constitutional:  Alert and oriented. Well appearing and in no acute distress. Eyes: Conjunctivae are normal.  Head: Atraumatic. Nose: No congestion/rhinnorhea. Mouth/Throat: Mucous membranes are moist.  Neck: No stridor.   Cardiovascular: Normal rate, regular rhythm. Grossly normal heart sounds.  Good peripheral circulation. Respiratory: Normal respiratory effort.  No retractions. Lungs CTAB. Gastrointestinal: Soft with mild left as well as right upper quadrant tenderness to palpation without any rebound or guarding. There is a negative Murphy sign. There is no tenderness to the lower abdomen to superficial as well as deep palpation. Specifically, there is no tenderness over McBurney's point which I asked the patient responded specifically as I was palpating there several times. No distention. No CVA tenderness. Musculoskeletal: No lower extremity tenderness nor edema.  No joint effusions. Neurologic:  Normal speech and language. No gross focal neurologic deficits are appreciated. Skin:  Skin is warm, dry and intact. No rash noted. Psychiatric: Mood and affect are normal. Speech and behavior are normal.  ____________________________________________   LABS (all labs ordered are listed, but only abnormal results are displayed)  Labs Reviewed  COMPREHENSIVE METABOLIC PANEL - Abnormal; Notable for the following:       Result Value   Potassium 3.4 (*)    Glucose, Bld 106 (*)    ALT 11 (*)    All other components within normal limits  CBC - Abnormal; Notable for the following:    WBC 12.5 (*)    HCT 39.8 (*)    MCV 73.2 (*)    MCH 24.0 (*)    All other components within normal limits  URINALYSIS, COMPLETE (UACMP) WITH MICROSCOPIC - Abnormal; Notable for the following:    Color, Urine AMBER (*)    APPearance CLEAR (*)    Specific Gravity, Urine 1.039 (*)    Ketones, ur 5 (*)    Protein, ur 30 (*)    Squamous Epithelial / LPF 0-5 (*)    All other components within normal limits  LIPASE, BLOOD    TROPONIN I   ____________________________________________  EKG  ED ECG REPORT I, Arelia LongestSchaevitz,  Marquese Burkland M, the attending physician, personally viewed and interpreted this ECG.   Date: 01/23/2017  EKG Time: 2211  Rate: 55  Rhythm: sinus bradycardia  Axis: Normal  Intervals:none  ST&T Change: ST segment elevation in V3 likely benign early repolarization. Similar morphology in V4 and V5. Similar appearance to previous EKG in the record of 12/06/2015.  ____________________________________________  RADIOLOGY   ____________________________________________   PROCEDURES  Procedure(s) performed:   Procedures  Critical Care performed:   ____________________________________________   INITIAL IMPRESSION / ASSESSMENT AND PLAN / ED COURSE  Pertinent labs & imaging results that were available during my care of the  patient were reviewed by me and considered in my medical decision making (see chart for details).  ----------------------------------------- 1:29 AM on 01/23/2017 -----------------------------------------  Patient at this time has pain of 6 out of 10 which is improved from previous. He is able tolerate by mouth fluids. I reexamined his abdomen and he has only very minimal tenderness to left upper quadrant. There is no right upper quadrant tenderness and no tenderness to the lower abdomen. My plan at this point is to discharge him home with Zofran as well as Pepcid. We discussed strict return precautions, for any worsening or concerning symptoms especially if his pain moves to the lower abdomen in the right lower quadrant. We specifically discussed that these were signs that he could have an appendicitis. Although his white blood cell count slightly elevated at this time, he has had no lower quadrant abdominal pain and his symptoms appear to be improving. We discussed that if his pain were to worsen or any symptoms were to worsen that he would come back for a CAT scan. He is  understanding of this plan and his mother is understanding of this plan and is also bedside.      ____________________________________________   FINAL CLINICAL IMPRESSION(S) / ED DIAGNOSES  Abdominal pain. Nausea vomiting and diarrhea.    NEW MEDICATIONS STARTED DURING THIS VISIT:  New Prescriptions   No medications on file     Note:  This document was prepared using Dragon voice recognition software and may include unintentional dictation errors.     Myrna Blazer, MD 01/23/17 0130

## 2017-11-13 ENCOUNTER — Encounter: Payer: Self-pay | Admitting: Emergency Medicine

## 2017-11-13 ENCOUNTER — Ambulatory Visit
Admission: EM | Admit: 2017-11-13 | Discharge: 2017-11-13 | Disposition: A | Discharge status: Home / Self Care | Payer: Self-pay | Attending: Family Medicine | Admitting: Family Medicine

## 2017-11-13 ENCOUNTER — Other Ambulatory Visit: Payer: Self-pay

## 2017-11-13 DIAGNOSIS — R569 Unspecified convulsions: Secondary | ICD-10-CM

## 2017-11-13 HISTORY — DX: Unspecified convulsions: R56.9

## 2017-11-13 MED ORDER — LEVETIRACETAM 500 MG PO TABS: 500.0000 mg | ORAL_TABLET | Freq: Two times a day (BID) | ORAL | 1 refills | Status: DC

## 2017-11-13 NOTE — ED Triage Notes (Signed)
Patient states he has been having seizures for the past several months.  Last seizure was this morning while he was asleep.

## 2017-11-13 NOTE — Discharge Instructions (Signed)
We will call with an appt for him to see a neurologist.  Medication as prescribed.  No driving.  Take care  Dr. Adriana Simas

## 2017-11-13 NOTE — ED Provider Notes (Signed)
[facility]    CSN: [id number] Arrival date & time: 11/13/17  1040  History   Chief Complaint Chief Complaint  Patient presents with  . Seizures   HPI  22 year old male presents with new onset seizures.  Patient states that he has been having seizures for the past several months.  He said several witnessed seizure-like episodes.  Most recent one was this morning.  Mother states that she has witnessed these episodes.  Characterized by heavy breathing and then tonic clonic activity.  Mother states that they last for several minutes.  Mother endorses postictal period.  He has a recent tongue bite on the lateral aspect of his tongue.  He denies illicit drug use.  No reported prior history of seizure.  No other associated symptoms.  No other complaints  Past Medical History:  Diagnosis Date  . Abdominal pain, recurrent   . Allergy    Dust  . Anxiety   . Hematemesis   . Seizures [facility])    Patient Active Problem List   Diagnosis Date Noted  . Epigastric abdominal pain   . Hematemesis    Past Surgical History:  Procedure Laterality Date  . ESOPHAGOGASTRODUODENOSCOPY  09/19/2011   Procedure: ESOPHAGOGASTRODUODENOSCOPY (EGD);  Surgeon: Jon Gills, MD;  Location: [facility] OR;  Service: Gastroenterology;  Laterality: N/A;  . KNEE SURGERY Left     Home Medications    Prior to Admission medications   Medication Sig Start Date End Date Taking? Authorizing Provider  citalopram (CELEXA) 10 MG tablet Take 10 mg by mouth daily.   Yes [provider]  famotidine (PEPCID) 40 MG tablet Take 1 tablet (40 mg total) by mouth every evening. 01/23/17 01/23/18 Yes Schaevitz, Myra Rude, MD  ibuprofen (ADVIL,MOTRIN) 800 MG tablet Take 1 tablet (800 mg total) by mouth every 8 (eight) hours as needed. 11/11/16  Yes Rebecka Apley, MD  levETIRAcetam (KEPPRA) 500 MG tablet Take 1 tablet (500 mg total) by mouth 2 (two) times daily. 11/13/17   Tommie Sams, DO    Family  History Family History  Problem Relation Age of Onset  . Cholelithiasis Maternal Aunt   . Ulcers Maternal Grandmother   . Birth defects Maternal Grandmother   . Diabetes Maternal Grandmother   . Diabetes Maternal Grandfather   . Heart disease Maternal Grandfather   . Hyperlipidemia Maternal Grandfather   . Hypertension Maternal Grandfather    Social History Social History   Tobacco Use  . Smoking status: Never Smoker  . Smokeless tobacco: Never Used  Substance Use Topics  . Alcohol use: No  . Drug use: No   Allergies   Shellfish allergy  Review of Systems Review of Systems  Constitutional: Negative for fever.  Neurological: Positive for seizures.   Physical Exam Triage Vital Signs ED Triage Vitals  Enc Vitals Group     BP 11/13/17 1103 110/76     Pulse Rate 11/13/17 1103 (!) 54     Resp 11/13/17 1103 16     Temp 11/13/17 1103 98.3 F (36.8 C)     Temp Source 11/13/17 1103 Oral     SpO2 11/13/17 1103 100 %     Weight 11/13/17 1100 150 lb (68 kg)     Height 11/13/17 1100  (1.778 m)     Head Circumference --      Peak Flow --      Pain Score 11/13/17 1100 (S) 0     Pain Loc --  Pain Edu? --      Excl. in GC? --    Updated Vital Signs BP 110/76 (BP Location: Left Arm)   Pulse (!) 54   Temp 98.3 F (36.8 C) (Oral)   Resp 16   Ht  (1.778 m)   Wt 150 lb (68 kg)   SpO2 100%   BMI 21.52 kg/m   Physical Exam  Constitutional: He is oriented to person, place, and time. He appears well-developed. No distress.  HENT:  Head: Normocephalic and atraumatic.  Nose: Nose normal.  Eyes: Pupils are equal, round, and reactive to light. Conjunctivae and EOM are normal.  Cardiovascular: Normal rate and regular rhythm.  Pulmonary/Chest: Effort normal and breath sounds normal. He has no wheezes. He has no rales.  Neurological: He is alert and oriented to person, place, and time.  No apparent neurological deficits.  Normal strength.  Psychiatric: He has a  normal mood and affect. His behavior is normal.  Vitals reviewed.  UC Treatments / Results  Labs (all labs ordered are listed, but only abnormal results are displayed) Labs Reviewed - No data to display  EKG None  Radiology No results found.  Procedures Procedures (including critical care time)  Medications Ordered in UC Medications - No data to display  Initial Impression / Assessment and Plan / UC Course  I have reviewed the triage vital signs and the nursing notes.  Pertinent labs & imaging results that were available during my care of the patient were reviewed by me and considered in my medical decision making (see chart for details).    22 year old male presents with ongoing seizures.  History consistent with seizure.  A video was reviewed that was taken by the mother.  Video reviewed tonic clonic activity in a patient who was unresponsive.  Bite mark noted on the tongue today.  Given his clinical picture, I feel that he has underlying seizure disorder.  I am placing him on Keppra.  I have called you for neurology to arrange an appointment.  Final Clinical Impressions(s) / UC Diagnoses   Final diagnoses:  Seizure [facility])     Discharge Instructions     We will call with an appt for him to see a neurologist.  Medication as prescribed.  No driving.  Take care  Dr. Adriana Simas    ED Prescriptions    Medication Sig Dispense Auth. Provider   levETIRAcetam (KEPPRA) 500 MG tablet Take 1 tablet (500 mg total) by mouth 2 (two) times daily. 60 tablet Tommie Sams, DO     Controlled Substance Prescriptions [place] Controlled Substance Registry consulted? Not Applicable   Tommie Sams, DO 11/13/17 1307

## 2018-01-19 ENCOUNTER — Telehealth: Payer: Self-pay | Admitting: Neurology

## 2018-01-19 ENCOUNTER — Encounter: Payer: Self-pay | Admitting: Neurology

## 2018-01-19 ENCOUNTER — Ambulatory Visit: Payer: Self-pay | Admitting: Neurology

## 2018-01-19 VITALS — BP 107/66 | HR 60 | Ht 70.0 in | Wt 145.5 lb

## 2018-01-19 DIAGNOSIS — G40909 Epilepsy, unspecified, not intractable, without status epilepticus: Secondary | ICD-10-CM

## 2018-01-19 MED ORDER — LAMOTRIGINE 25 MG PO TABS: 25.0000 mg | ORAL_TABLET | Freq: Two times a day (BID) | ORAL | 0 refills | Status: DC

## 2018-01-19 MED ORDER — LAMOTRIGINE 100 MG PO TABS: 100.0000 mg | ORAL_TABLET | Freq: Two times a day (BID) | ORAL | 11 refills | Status: DC

## 2018-01-19 NOTE — Progress Notes (Signed)
PATIENT: Timothy NickelsLindsay D Krummel DOB: 07-17-1995  Chief Complaint  Patient presents with  . Seizures    He is here with his mother, Timothy Cole.  He started having seizures two years ago and never started medication due to limited finances.  His most recent event was in May 2019.  He was started on Keppra 500mg , one tablet BID.    Marland Kitchen. NO PCP    Tommie Samsook, Jayce G, DO - referred by urgent care     HISTORICAL  Timothy Cole is a 22 year old male, seen in request by urgent care physician Dr. Everlene Otherook, Jayce at visit on January 19, 2018 for evaluation of seizure. He is accompanied by his mother Timothy Cole at today's visit.   He was full-term, developmentally normal, he began to have seizures since 2017, have seizure once or twice each month, some of the seizure happened in sleep, he fell off bed, biting his tongue, no incontinence, I was able to watch a video recording of him fell off bed, confused, moaning, but there was no typical generalized tonic-clonic activity, no foaming coming out of his mouth.  Most recent seizure was on Nov 13, 2017, it happened during sleep, there was no family history of seizure,  He is now taking Keppra 500 mg twice daily, also Celexa 10 mg daily for depression.  REVIEW OF SYSTEMS: Full 14 system review of systems performed and notable only for fever, chills, weight loss, spinning sensation, trouble swallowing, blurred vision, eye pain, shortness of breath, cough, wheezing, snoring, constipation, anemia, feeling hot, cold, increased thirst, flushing, joint pain, allergy, runny nose, memory loss, confusion, headaches, numbness, weakness, slurred speech, dizziness, seizure, passing out, insomnia, sleepiness, snoring, restless leg, depression, anxiety, decreased energy, change in appetite, disinterested in activities, suicidal thoughts, racing thoughts  ALLERGIES: Allergies  Allergen Reactions  . Shellfish Allergy Nausea And Vomiting    HOME MEDICATIONS: Current Outpatient Medications    Medication Sig Dispense Refill  . citalopram (CELEXA) 10 MG tablet Take 10 mg by mouth daily.    . famotidine (PEPCID) 40 MG tablet Take 1 tablet (40 mg total) by mouth every evening. 30 tablet 0  . ibuprofen (ADVIL,MOTRIN) 800 MG tablet Take 1 tablet (800 mg total) by mouth every 8 (eight) hours as needed. 15 tablet 0  . levETIRAcetam (KEPPRA) 500 MG tablet Take 1 tablet (500 mg total) by mouth 2 (two) times daily. 60 tablet 1   No current facility-administered medications for this visit.     PAST MEDICAL HISTORY: Past Medical History:  Diagnosis Date  . Abdominal pain, recurrent   . Allergy    Dust  . Anxiety   . Hematemesis   . Seizures (HCC)     PAST SURGICAL HISTORY: Past Surgical History:  Procedure Laterality Date  . ESOPHAGOGASTRODUODENOSCOPY  09/19/2011   Procedure: ESOPHAGOGASTRODUODENOSCOPY (EGD);  Surgeon: Jon GillsJoseph H Clark, MD;  Location: [facility] OR;  Service: Gastroenterology;  Laterality: N/A;  . KNEE SURGERY Left     FAMILY HISTORY: Family History  Problem Relation Age of Onset  . Cholelithiasis Maternal Aunt   . Ulcers Maternal Grandmother   . Birth defects Maternal Grandmother   . Diabetes Maternal Grandmother   . Diabetes Maternal Grandfather   . Heart disease Maternal Grandfather   . Hyperlipidemia Maternal Grandfather   . Hypertension Maternal Grandfather   . Pancreatic cancer Maternal Grandfather   . Hypertension Mother   . Other Father        unsure of history    SOCIAL  HISTORY: Social History   Socioeconomic History  . Marital status: Single    Spouse name: Not on file  . Number of children: 0  . Years of education: 64  . Highest education level: High school graduate  Occupational History  . Occupation: Unemployed  Social Needs  . Financial resource strain: Not on file  . Food insecurity:    Worry: Not on file    Inability: Not on file  . Transportation needs:    Medical: Not on file    Non-medical: Not on file  Tobacco Use  . Smoking  status: Current Some Day Smoker    Types: Cigarettes  . Smokeless tobacco: Never Used  Substance and Sexual Activity  . Alcohol use: No  . Drug use: No  . Sexual activity: Not on file  Lifestyle  . Physical activity:    Days per week: Not on file    Minutes per session: Not on file  . Stress: Not on file  Relationships  . Social connections:    Talks on phone: Not on file    Gets together: Not on file    Attends religious service: Not on file    Active member of club or organization: Not on file    Attends meetings of clubs or organizations: Not on file    Relationship status: Not on file  . Intimate partner violence:    Fear of current or ex partner: Not on file    Emotionally abused: Not on file    Physically abused: Not on file    Forced sexual activity: Not on file  Other Topics Concern  . Not on file  Social History Narrative   Lives at home with his mother.   Drinks [employer] at least three days out of the week and some soda.   Right-handed.     PHYSICAL EXAM   Vitals:   01/19/18 1146  BP: 107/66  Pulse: 60  Weight: 145 lb 8 oz (66 kg)  Height: 5\' 10"  (1.778 m)    Not recorded      Body mass index is 20.88 kg/m.  PHYSICAL EXAMNIATION:  Gen: NAD, conversant, well nourised, obese, well groomed                     Cardiovascular: Regular rate rhythm, no peripheral edema, warm, nontender. Eyes: Conjunctivae clear without exudates or hemorrhage Neck: Supple, no carotid bruits. Pulmonary: Clear to auscultation bilaterally   NEUROLOGICAL EXAM:  MENTAL STATUS: Speech:    Speech is normal; fluent and spontaneous with normal comprehension.  Cognition:     Orientation to time, place and person     Normal recent and remote memory     Normal Attention span and concentration     Normal Language, naming, repeating,spontaneous speech     Fund of knowledge   CRANIAL NERVES: CN II: Visual fields are full to confrontation. Fundoscopic exam is normal with sharp  discs and no vascular changes. Pupils are round equal and briskly reactive to light. CN III, IV, VI: extraocular movement are normal. No ptosis. CN V: Facial sensation is intact to pinprick in all 3 divisions bilaterally. Corneal responses are intact.  CN VII: Face is symmetric with normal eye closure and smile. CN VIII: Hearing is normal to rubbing fingers CN IX, X: Palate elevates symmetrically. Phonation is normal. CN XI: Head turning and shoulder shrug are intact CN XII: Tongue is midline with normal movements and no atrophy.  MOTOR: There  is no pronator drift of out-stretched arms. Muscle bulk and tone are normal. Muscle strength is normal.  REFLEXES: Reflexes are 2+ and symmetric at the biceps, triceps, knees, and ankles. Plantar responses are flexor.  SENSORY: Intact to light touch, pinprick, positional sensation and vibratory sensation are intact in fingers and toes.  COORDINATION: Rapid alternating movements and fine finger movements are intact. There is no dysmetria on finger-to-nose and heel-knee-shin.    GAIT/STANCE: Posture is normal. Gait is steady with normal steps, base, arm swing, and turning. Heel and toe walking are normal. Tandem gait is normal.  Romberg is absent.   DIAGNOSTIC DATA (LABS, IMAGING, TESTING) - I reviewed patient records, labs, notes, testing and imaging myself where available.   ASSESSMENT AND PLAN  Minoru Chap Ardizzone is a 22 y.o. male   Seizure-like activity  Complete evaluation with MRI of the brain with without contrast  EEG  He complains of moodiness with Keppra, will switch to lamotrigine, titrating to 100 mg twice a day   Levert Feinstein, M.D. Ph.D.  [facility] [address], [address] [place], [place] [address] Ph: [phone] Fax: [phone]  CC:  Tommie Sams, DO

## 2018-01-19 NOTE — Telephone Encounter (Signed)
self pay order sent to [facility] . They will reach out to the pt to schedule.  °

## 2018-01-19 NOTE — Patient Instructions (Signed)
No driving until seizure-free for 6 months  Call clinic for recurrent seizures Return to clinic in 6 months

## 2018-01-25 ENCOUNTER — Ambulatory Visit: Payer: Self-pay | Admitting: Neurology

## 2018-01-25 DIAGNOSIS — G40909 Epilepsy, unspecified, not intractable, without status epilepticus: Secondary | ICD-10-CM

## 2018-02-03 NOTE — Procedures (Signed)
   HISTORY: 22 years old male, with history of seizure,  TECHNIQUE:  16 channel EEG was performed based on standard 10-16 international system. One channel was dedicated to EKG, which has demonstrates regular rhythm of 48 beats per minutes.  Upon awakening, the posterior background activity was well-developed, in alpha range, reactive to eye opening and closure.  There was no evidence of epileptiform discharge.  Photic stimulation was performed, which induced a symmetric photic driving.  Hyperventilation was performed, there was no abnormality elicit.  Stage II sleep was achieved as evident by K complexes and sleep spindles  CONCLUSION: This is a  normal awake and sleep EEG.  There is no electrodiagnostic evidence of epileptiform discharge.  Levert FeinsteinYijun Ellanora Rayborn, M.D. Ph.D.  [facility] [address] [place], [place] [address] Phone: [phone] Fax:      [phone]

## 2018-02-17 ENCOUNTER — Ambulatory Visit
Admission: RE | Admit: 2018-02-17 | Discharge: 2018-02-17 | Disposition: A | Discharge status: Home / Self Care | Payer: Self-pay | Source: Ambulatory Visit | Attending: Neurology | Admitting: Neurology

## 2018-02-17 DIAGNOSIS — G40909 Epilepsy, unspecified, not intractable, without status epilepticus: Secondary | ICD-10-CM

## 2018-02-17 MED ORDER — GADOBENATE DIMEGLUMINE 529 MG/ML IV SOLN
13.0000 mL | Freq: Once | INTRAVENOUS | Status: AC | PRN
  Administered 2018-02-17: 13 mL via INTRAVENOUS

## 2018-02-19 ENCOUNTER — Telehealth: Payer: Self-pay | Admitting: *Deleted

## 2018-02-19 NOTE — Telephone Encounter (Signed)
Spoke to patient - he is aware of his normal MRI brain results.

## 2018-02-19 NOTE — Telephone Encounter (Signed)
-----   Message from Levert FeinsteinYijun Yan, MD sent at 02/19/2018 10:02 AM EDT ----- Please call pt for normal MRI brain.

## 2018-08-03 ENCOUNTER — Encounter: Payer: Self-pay | Admitting: Emergency Medicine

## 2018-08-03 ENCOUNTER — Other Ambulatory Visit: Payer: Self-pay

## 2018-08-03 ENCOUNTER — Emergency Department
Admission: EM | Admit: 2018-08-03 | Discharge: 2018-08-03 | Disposition: A | Discharge status: Home / Self Care | Payer: BLUE CROSS/BLUE SHIELD | Attending: Emergency Medicine | Admitting: Emergency Medicine

## 2018-08-03 ENCOUNTER — Emergency Department: Payer: BLUE CROSS/BLUE SHIELD

## 2018-08-03 DIAGNOSIS — F17210 Nicotine dependence, cigarettes, uncomplicated: Secondary | ICD-10-CM | POA: Diagnosis not present

## 2018-08-03 DIAGNOSIS — G44009 Cluster headache syndrome, unspecified, not intractable: Secondary | ICD-10-CM | POA: Diagnosis not present

## 2018-08-03 DIAGNOSIS — Z79899 Other long term (current) drug therapy: Secondary | ICD-10-CM | POA: Insufficient documentation

## 2018-08-03 DIAGNOSIS — F419 Anxiety disorder, unspecified: Secondary | ICD-10-CM | POA: Diagnosis not present

## 2018-08-03 DIAGNOSIS — R51 Headache: Secondary | ICD-10-CM | POA: Diagnosis present

## 2018-08-03 MED ORDER — SODIUM CHLORIDE 0.9 % IV BOLUS
1000.0000 mL | Freq: Once | INTRAVENOUS | Status: AC
  Administered 2018-08-03: 1000 mL via INTRAVENOUS

## 2018-08-03 MED ORDER — HYDROCODONE-ACETAMINOPHEN 5-325 MG PO TABS
1.0000 | ORAL_TABLET | Freq: Once | ORAL | Status: AC
  Administered 2018-08-03: 1 via ORAL
  Filled 2018-08-03: qty 1

## 2018-08-03 MED ORDER — DIPHENHYDRAMINE HCL 50 MG/ML IJ SOLN
25.0000 mg | Freq: Once | INTRAMUSCULAR | Status: AC
  Administered 2018-08-03: 25 mg via INTRAVENOUS
  Filled 2018-08-03: qty 1

## 2018-08-03 MED ORDER — KETOROLAC TROMETHAMINE 30 MG/ML IJ SOLN
30.0000 mg | Freq: Once | INTRAMUSCULAR | Status: AC
  Administered 2018-08-03: 30 mg via INTRAVENOUS
  Filled 2018-08-03: qty 1

## 2018-08-03 MED ORDER — METOCLOPRAMIDE HCL 5 MG/ML IJ SOLN
10.0000 mg | Freq: Once | INTRAMUSCULAR | Status: AC
  Administered 2018-08-03: 10 mg via INTRAVENOUS
  Filled 2018-08-03: qty 2

## 2018-08-03 MED ORDER — SODIUM CHLORIDE 0.9 % IV BOLUS: 1000.0000 mL | Freq: Once | INTRAVENOUS | Status: DC

## 2018-08-03 NOTE — Discharge Instructions (Signed)
Follow-up with your primary care provider or [facility] acute care if any continued problems.  Read the information about cluster headaches.  Continue to drink fluids to stay hydrated.

## 2018-08-03 NOTE — ED Notes (Signed)
See triage note  Presents with headache  for the past 3 days  States pain is easing off at present no fever or n/v at present but states she did have couple of episodes of vomiting

## 2018-08-03 NOTE — ED Triage Notes (Signed)
Pt here with c/o headache for 3 days, states "bump" like pimple on head the past few days, however going down now. Has had a few episodes of vomiting. VSS. NAD.

## 2018-08-03 NOTE — ED Provider Notes (Signed)
[facility] Emergency Department Provider Note   ____________________________________________   First MD Initiated Contact with Patient 08/03/18 1317     (approximate)  I have reviewed the triage vital signs and the nursing notes.   HISTORY  Chief Complaint Headache   HPI Timothy Cole is a 23 y.o. male presents to the ED with complaint of left sided headache for the last 3 days.  Patient states that he had some watering of the eye on the same side.  He denies any visual changes.  He had a few episodes of vomiting which is resolved.  He denies any past history of migraines.  He denies any history of head injuries.  He has taken over-the-counter medication without any relief of his headache.  Currently he rates his pain as a 10/10.    Past Medical History:  Diagnosis Date  . Abdominal pain, recurrent   . Allergy    Dust  . Anxiety   . Hematemesis   . Seizures [facility])     Patient Active Problem List   Diagnosis Date Noted  . Nonintractable epilepsy without status epilepticus (HCC) 01/19/2018  . Epigastric abdominal pain   . Hematemesis     Past Surgical History:  Procedure Laterality Date  . ESOPHAGOGASTRODUODENOSCOPY  09/19/2011   Procedure: ESOPHAGOGASTRODUODENOSCOPY (EGD);  Surgeon: Jon Gills, MD;  Location: [facility] OR;  Service: Gastroenterology;  Laterality: N/A;  . KNEE SURGERY Left     Prior to Admission medications   Medication Sig Start Date End Date Taking? Authorizing Provider  citalopram (CELEXA) 10 MG tablet Take 10 mg by mouth daily.    [provider]  famotidine (PEPCID) 40 MG tablet Take 1 tablet (40 mg total) by mouth every evening. 01/23/17 01/23/18  Schaevitz, Myra Rude, MD  lamoTRIgine (LAMICTAL) 100 MG tablet Take 1 tablet (100 mg total) by mouth 2 (two) times daily. 01/19/18   Levert Feinstein, MD  lamoTRIgine (LAMICTAL) 25 MG tablet Take 1 tablet (25 mg total) by mouth 2 (two) times daily. 1 tablet twice a day for the  first week 2 tablets twice a day for the second week 3 tablets twice a day for the third week 4 tablets twice a day for the fourth week  For total of 140 tablets  After finish titration with small dose of lamotrigine 25 mg, change to lamotrigine 100 mg twice a day 01/19/18   Levert Feinstein, MD    Allergies Shellfish allergy  Family History  Problem Relation Age of Onset  . Cholelithiasis Maternal Aunt   . Ulcers Maternal Grandmother   . Birth defects Maternal Grandmother   . Diabetes Maternal Grandmother   . Diabetes Maternal Grandfather   . Heart disease Maternal Grandfather   . Hyperlipidemia Maternal Grandfather   . Hypertension Maternal Grandfather   . Pancreatic cancer Maternal Grandfather   . Hypertension Mother   . Other Father        unsure of history    Social History Social History   Tobacco Use  . Smoking status: Current Some Day Smoker    Types: Cigarettes  . Smokeless tobacco: Never Used  Substance Use Topics  . Alcohol use: No  . Drug use: No    Review of Systems Constitutional: No fever/chills Eyes: No visual changes. ENT: No sore throat. Cardiovascular: Denies chest pain. Respiratory: Denies shortness of breath. Gastrointestinal: No abdominal pain.  No nausea, no vomiting.  Musculoskeletal: Negative for back pain. Skin: Negative for rash. Neurological: Positive  for left-sided headache, focal weakness or numbness.  ____________________________________________   PHYSICAL EXAM:  VITAL SIGNS: ED Triage Vitals  Enc Vitals Group     BP 08/03/18 1244 125/84     Pulse Rate 08/03/18 1244 62     Resp 08/03/18 1244 18     Temp 08/03/18 1244 98.3 F (36.8 C)     Temp Source 08/03/18 1244 Oral     SpO2 08/03/18 1244 99 %     Weight 08/03/18 1245 160 lb (72.6 kg)     Height 08/03/18 1245 5\' 9"  (1.753 m)     Head Circumference --      Peak Flow --      Pain Score 08/03/18 1244 10     Pain Loc --      Pain Edu? --      Excl. in GC? --     Constitutional: Alert and oriented. Well appearing and in no acute distress.  Patient answers questions appropriately and is cooperative. Eyes: Conjunctivae are normal. PERRL. EOMI. Head: Atraumatic. Nose: No congestion/rhinnorhea. Mouth/Throat: Mucous membranes are moist.  Oropharynx non-erythematous. Neck: No stridor.   Cardiovascular: Normal rate, regular rhythm. Grossly normal heart sounds.  Good peripheral circulation. Respiratory: Normal respiratory effort.  No retractions. Lungs CTAB. Gastrointestinal: Soft and nontender. No distention.  Musculoskeletal: Moves upper and lower extremities without any difficulty normal gait was noted.  Muscle strength bilaterally. Neurologic:  Normal speech and language. No gross focal neurologic deficits are appreciated.  Cranial nerves II through XII grossly intact.  No gait instability. Skin:  Skin is warm, dry and intact. No rash noted. Psychiatric: Mood and affect are normal. Speech and behavior are normal.  ____________________________________________   LABS (all labs ordered are listed, but only abnormal results are displayed)  Labs Reviewed - No data to display  RADIOLOGY  Official radiology report(s): Ct Head Wo Contrast  Result Date: 08/03/2018 CLINICAL DATA:  Headache for the past 3 days. Recent episodes of vomiting. EXAM: CT HEAD WITHOUT CONTRAST TECHNIQUE: Contiguous axial images were obtained from the base of the skull through the vertex without intravenous contrast. COMPARISON:  Brain MR dated 02/17/2018 and head CT dated 11/11/2016. FINDINGS: Brain: Normal appearing cerebral hemispheres and posterior fossa structures. Normal size and position of the ventricles. No intracranial hemorrhage, mass lesion or CT evidence of acute infarction. Vascular: No hyperdense vessel or unexpected calcification. Skull: Normal. Negative for fracture or focal lesion. Sinuses/Orbits: Unremarkable. Other: None. IMPRESSION: Normal examination.  Electronically Signed   By: Beckie Salts M.D.   On: 08/03/2018 14:26    ____________________________________________   PROCEDURES  Procedure(s) performed: None  Procedures  Critical Care performed: No  ____________________________________________   INITIAL IMPRESSION / ASSESSMENT AND PLAN / ED COURSE  As part of my medical decision making, I reviewed the following data within the electronic MEDICAL RECORD NUMBER Notes from prior ED visits and [place] Controlled Substance Database  Presents to the ED with complaint of left-sided headache for the last 3 days that is unrelieved by over-the-counter medication.  He states that he also has watering from his left eye along with some mild photophobia and intermittent episodes of vomiting which have resolved at this time.  Patient denies any fever, chills, or body aches to suggest influenza.  Patient was reassured after CT scan was negative.  He received Toradol, Benadryl, and Reglan IV along with IV fluids and was only slightly improved.  He was given Norco 1 tablet and O2 via nasal cannula and improved greatly.  Patient was discharged improved and ambulatory without any assistance.  He is to follow-up with his PCP or can no clinic acute care if any continued problems.  ____________________________________________   FINAL CLINICAL IMPRESSION(S) / ED DIAGNOSES  Final diagnoses:  Cluster headache, not intractable, unspecified chronicity pattern     ED Discharge Orders    None       Note:  This document was prepared using Dragon voice recognition software and may include unintentional dictation errors.    Tommi RumpsSummers, Bexley Mclester L, PA-C 08/03/18 1911    Emily FilbertWilliams, Jonathan E, MD 08/04/18 [phone]

## 2018-08-04 ENCOUNTER — Ambulatory Visit: Payer: Self-pay | Admitting: Nurse Practitioner

## 2018-08-23 ENCOUNTER — Ambulatory Visit: Payer: Self-pay | Admitting: Neurology

## 2018-08-23 ENCOUNTER — Telehealth: Payer: Self-pay | Admitting: *Deleted

## 2018-08-23 NOTE — Telephone Encounter (Signed)
No showed follow up appointment. 

## 2018-08-24 ENCOUNTER — Encounter: Payer: Self-pay | Admitting: Neurology

## 2018-12-09 ENCOUNTER — Other Ambulatory Visit: Payer: Self-pay

## 2018-12-09 ENCOUNTER — Encounter: Payer: Self-pay | Admitting: Emergency Medicine

## 2018-12-09 ENCOUNTER — Emergency Department
Admission: EM | Admit: 2018-12-09 | Discharge: 2018-12-09 | Disposition: A | Discharge status: Home / Self Care | Payer: BLUE CROSS/BLUE SHIELD | Attending: Emergency Medicine | Admitting: Emergency Medicine

## 2018-12-09 DIAGNOSIS — F17210 Nicotine dependence, cigarettes, uncomplicated: Secondary | ICD-10-CM | POA: Diagnosis not present

## 2018-12-09 DIAGNOSIS — Z79899 Other long term (current) drug therapy: Secondary | ICD-10-CM | POA: Diagnosis not present

## 2018-12-09 DIAGNOSIS — R569 Unspecified convulsions: Secondary | ICD-10-CM | POA: Diagnosis not present

## 2018-12-09 LAB — CBC
HCT: 41.8 % (ref 39.0–52.0)
Hemoglobin: 12.7 g/dL — ABNORMAL LOW (ref 13.0–17.0)
MCH: 23.3 pg — ABNORMAL LOW (ref 26.0–34.0)
MCHC: 30.4 g/dL (ref 30.0–36.0)
MCV: 76.7 fL — ABNORMAL LOW (ref 80.0–100.0)
Platelets: 196 10*3/uL (ref 150–400)
RBC: 5.45 MIL/uL (ref 4.22–5.81)
RDW: 14.6 % (ref 11.5–15.5)
WBC: 8.3 10*3/uL (ref 4.0–10.5)
nRBC: 0 % (ref 0.0–0.2)

## 2018-12-09 LAB — VALPROIC ACID LEVEL: Valproic Acid Lvl: <10 ug/mL — ABNORMAL LOW (ref 50.0–100.0)

## 2018-12-09 LAB — BASIC METABOLIC PANEL
Anion gap: 13 (ref 5–15)
BUN: 12 mg/dL (ref 6–20)
CO2: 23 mmol/L (ref 22–32)
Calcium: 9.3 mg/dL (ref 8.9–10.3)
Chloride: 104 mmol/L (ref 98–111)
Creatinine, Ser: 1.44 mg/dL — ABNORMAL HIGH (ref 0.61–1.24)
GFR calc Af Amer: >60 mL/min (ref >60)
GFR calc non Af Amer: >60 mL/min (ref >60)
Glucose, Bld: 109 mg/dL — ABNORMAL HIGH (ref 70–99)
Potassium: 3.6 mmol/L (ref 3.5–5.1)
Sodium: 140 mmol/L (ref 135–145)

## 2018-12-09 MED ORDER — LAMOTRIGINE 100 MG PO TABS
100.0000 mg | ORAL_TABLET | Freq: Once | ORAL | Status: AC
  Administered 2018-12-09: 100 mg via ORAL
  Filled 2018-12-09: qty 1

## 2018-12-09 MED ORDER — LAMOTRIGINE 100 MG PO TABS: 100.0000 mg | ORAL_TABLET | Freq: Two times a day (BID) | ORAL | 0 refills | Status: AC

## 2018-12-09 NOTE — ED Provider Notes (Signed)
[facility] Emergency Department Provider Note   ____________________________________________   First MD Initiated Contact with Patient 12/09/18 [phone]     (approximate)  I have reviewed the triage vital signs and the nursing notes.   HISTORY  Chief Complaint Seizures    HPI Timothy Cole is a 23 y.o. male brought to the ED from home via EMS status post seizure.  Patient has history of seizure disorder and has not taken his medicine in the past month.  Wife noted tonic-clonic seizure lasting approximately 1 minute.  Patient arrives to the ED alert oriented.  Denies recent fever, cough, chest pain, shortness of breath, abdominal pain, nausea or vomiting.  Denies recent travel, trauma or exposure to persons diagnosed with coronavirus.       Past Medical History:  Diagnosis Date   Abdominal pain, recurrent    Allergy    Dust   Anxiety    Hematemesis    Seizures [facility])     Patient Active Problem List   Diagnosis Date Noted   Nonintractable epilepsy without status epilepticus ([place]) 01/19/2018   Epigastric abdominal pain    Hematemesis     Past Surgical History:  Procedure Laterality Date   ESOPHAGOGASTRODUODENOSCOPY  09/19/2011   Procedure: ESOPHAGOGASTRODUODENOSCOPY (EGD);  Surgeon: Oletha Blend, MD;  Location: [place];  Service: Gastroenterology;  Laterality: N/A;   KNEE SURGERY Left     Prior to Admission medications   Medication Sig Start Date End Date Taking? Authorizing Provider  citalopram (CELEXA) 10 MG tablet Take 10 mg by mouth daily.    [provider]  famotidine (PEPCID) 40 MG tablet Take 1 tablet (40 mg total) by mouth every evening. 01/23/17 01/23/18  Schaevitz, Randall An, MD  lamoTRIgine (LAMICTAL) 100 MG tablet Take 1 tablet (100 mg total) by mouth 2 (two) times daily. 12/09/18   Paulette Blanch, MD    Allergies Shellfish allergy  Family History  Problem Relation Age of Onset   Cholelithiasis Maternal Aunt     Ulcers Maternal Grandmother    Birth defects Maternal Grandmother    Diabetes Maternal Grandmother    Diabetes Maternal Grandfather    Heart disease Maternal Grandfather    Hyperlipidemia Maternal Grandfather    Hypertension Maternal Grandfather    Pancreatic cancer Maternal Grandfather    Hypertension Mother    Other Father        unsure of history    Social History Social History   Tobacco Use   Smoking status: Current Some Day Smoker    Types: Cigarettes   Smokeless tobacco: Never Used  Substance Use Topics   Alcohol use: No   Drug use: No    Review of Systems  Constitutional: No fever/chills Eyes: No visual changes. ENT: No sore throat. Cardiovascular: Denies chest pain. Respiratory: Denies shortness of breath. Gastrointestinal: No abdominal pain.  No nausea, no vomiting.  No diarrhea.  No constipation. Genitourinary: Negative for dysuria. Musculoskeletal: Negative for back pain. Skin: Negative for rash. Neurological: Positive for seizure.  Negative for headaches, focal weakness or numbness.   ____________________________________________   PHYSICAL EXAM:  VITAL SIGNS: ED Triage Vitals  Enc Vitals Group     BP 12/09/18 0456 118/78     Pulse Rate 12/09/18 0456 70     Resp 12/09/18 0456 20     Temp 12/09/18 0456 97.8 F (36.6 C)     Temp Source 12/09/18 0456 Oral     SpO2 12/09/18 0456 98 %  Weight 12/09/18 0457 150 lb (68 kg)     Height 12/09/18 0457 5\' 10"  (1.778 m)     Head Circumference --      Peak Flow --      Pain Score 12/09/18 0457 8     Pain Loc --      Pain Edu? --      Excl. in GC? --     Constitutional: Alert and oriented. Well appearing and in no acute distress. Eyes: Conjunctivae are normal. PERRL. EOMI. Head: Atraumatic. Nose: Atraumatic. Mouth/Throat: Mucous membranes are moist.  Did not bite tongue. Neck: No stridor.  No cervical spine tenderness to palpation. Cardiovascular: Normal rate, regular rhythm.  Grossly normal heart sounds.  Good peripheral circulation. Respiratory: Normal respiratory effort.  No retractions. Lungs CTAB. Gastrointestinal: Soft and nontender. No distention. No abdominal bruits. No CVA tenderness. Musculoskeletal: No lower extremity tenderness nor edema.  No joint effusions. Neurologic:  Normal speech and language. No gross focal neurologic deficits are appreciated.  Skin:  Skin is warm, dry and intact. No rash noted. Psychiatric: Mood and affect are normal. Speech and behavior are normal.  ____________________________________________   LABS (all labs ordered are listed, but only abnormal results are displayed)  Labs Reviewed  BASIC METABOLIC PANEL - Abnormal; Notable for the following components:      Result Value   Glucose, Bld 109 (*)    Creatinine, Ser 1.44 (*)    All other components within normal limits  CBC - Abnormal; Notable for the following components:   Hemoglobin 12.7 (*)    MCV 76.7 (*)    MCH 23.3 (*)    All other components within normal limits  VALPROIC ACID LEVEL - Abnormal; Notable for the following components:   Valproic Acid Lvl <10 (*)    All other components within normal limits  CBG MONITORING, ED   ____________________________________________  EKG  None ____________________________________________  RADIOLOGY  ED MD interpretation: None  Official radiology report(s): No results found.  ____________________________________________   PROCEDURES  Procedure(s) performed (including Critical Care):  Procedures   ____________________________________________   INITIAL IMPRESSION / ASSESSMENT AND PLAN / ED COURSE  As part of my medical decision making, I reviewed the following data within the electronic MEDICAL RECORD NUMBER Nursing notes reviewed and incorporated, Labs reviewed, Old chart reviewed and Notes from prior ED visits     Frances NickelsLindsay D Yam was evaluated in Emergency Department on 12/09/2018 for the symptoms  described in the history of present illness. He was evaluated in the context of the global COVID-19 pandemic, which necessitated consideration that the patient might be at risk for infection with the SARS-CoV-2 virus that causes COVID-19. Institutional protocols and algorithms that pertain to the evaluation of patients at risk for COVID-19 are in a state of rapid change based on information released by regulatory bodies including the CDC and federal and state organizations. These policies and algorithms were followed during the patient's care in the ED.   23 year old male with seizure disorder who presents with brief seizure in the setting of medication noncompliance.  He is alert and oriented without postictal state.  No focal neurological deficits.  Patient does not know what medicine he takes.  I reviewed patient's old chart and do not see anywhere that he was taking valproic acid.  Last note by his neurologist 12/2017 indicates normal EEG and that patient titrated up to Lamictal 100 mg twice daily.  Patient states he last took his medicines 1 month ago.  Will  place him back on Lamictal, check basic lab work and reassess.   Clinical Course as of Dec 08 704  Thu Dec 09, 2018  0656 Patient resting no acute distress.  Will discharge home with Lamictal 100 mg twice daily and encouraged him to see his neurologist.  Strict return precautions given.  Patient verbalizes understanding and agrees with plan of care.   [JS]    Clinical Course User Index [JS] Irean HongSung, Sharley Keeler J, MD     ____________________________________________   FINAL CLINICAL IMPRESSION(S) / ED DIAGNOSES  Final diagnoses:  Seizure [facility](HCC)     ED Discharge Orders         Ordered    lamoTRIgine (LAMICTAL) 100 MG tablet  2 times daily     12/09/18 0705           Note:  This document was prepared using Dragon voice recognition software and may include unintentional dictation errors.   Irean HongSung, Hansford Hirt J, MD 12/09/18 [phone]

## 2018-12-09 NOTE — Discharge Instructions (Addendum)
1.  Restart your seizure medicine. 2.  Avoid driving or operating heavy machinery until seen by your neurologist. 3.  Return to the ER for recurrent or worsening symptoms, persistent vomiting, difficulty breathing or other concerns.

## 2018-12-09 NOTE — ED Triage Notes (Signed)
Pt brought in from home by EMS, wife states he had a seizure that lasted approx 1 min. Pt has hx of the same but has not taken depakote in about a month.

## 2019-02-11 ENCOUNTER — Emergency Department
Admission: EM | Admit: 2019-02-11 | Discharge: 2019-02-11 | Disposition: A | Discharge status: Home / Self Care | Payer: BLUE CROSS/BLUE SHIELD | Attending: Emergency Medicine | Admitting: Emergency Medicine

## 2019-02-11 ENCOUNTER — Other Ambulatory Visit: Payer: Self-pay

## 2019-02-11 ENCOUNTER — Encounter: Payer: Self-pay | Admitting: Emergency Medicine

## 2019-02-11 DIAGNOSIS — R4182 Altered mental status, unspecified: Secondary | ICD-10-CM | POA: Diagnosis not present

## 2019-02-11 DIAGNOSIS — F1310 Sedative, hypnotic or anxiolytic abuse, uncomplicated: Secondary | ICD-10-CM | POA: Diagnosis not present

## 2019-02-11 DIAGNOSIS — R569 Unspecified convulsions: Secondary | ICD-10-CM | POA: Diagnosis not present

## 2019-02-11 DIAGNOSIS — F1210 Cannabis abuse, uncomplicated: Secondary | ICD-10-CM | POA: Diagnosis not present

## 2019-02-11 DIAGNOSIS — T50901A Poisoning by unspecified drugs, medicaments and biological substances, accidental (unintentional), initial encounter: Secondary | ICD-10-CM | POA: Diagnosis present

## 2019-02-11 HISTORY — DX: Other psychoactive substance abuse, uncomplicated: F19.10

## 2019-02-11 LAB — CBC WITH DIFFERENTIAL/PLATELET
Abs Immature Granulocytes: 0.17 10*3/uL — ABNORMAL HIGH (ref 0.00–0.07)
Basophils Absolute: 0.1 10*3/uL (ref 0.0–0.1)
Basophils Relative: 1 %
Eosinophils Absolute: 0.1 10*3/uL (ref 0.0–0.5)
Eosinophils Relative: 0 %
HCT: 43.8 % (ref 39.0–52.0)
Hemoglobin: 12.6 g/dL — ABNORMAL LOW (ref 13.0–17.0)
Immature Granulocytes: 1 %
Lymphocytes Relative: 30 %
Lymphs Abs: 4.5 10*3/uL — ABNORMAL HIGH (ref 0.7–4.0)
MCH: 23.3 pg — ABNORMAL LOW (ref 26.0–34.0)
MCHC: 28.8 g/dL — ABNORMAL LOW (ref 30.0–36.0)
MCV: 81.1 fL (ref 80.0–100.0)
Monocytes Absolute: 0.7 10*3/uL (ref 0.1–1.0)
Monocytes Relative: 4 %
Neutro Abs: 9.7 10*3/uL — ABNORMAL HIGH (ref 1.7–7.7)
Neutrophils Relative %: 64 %
Platelets: 250 10*3/uL (ref 150–400)
RBC: 5.4 MIL/uL (ref 4.22–5.81)
RDW: 16.2 % — ABNORMAL HIGH (ref 11.5–15.5)
WBC: 15.1 10*3/uL — ABNORMAL HIGH (ref 4.0–10.5)
nRBC: 0 % (ref 0.0–0.2)

## 2019-02-11 LAB — BASIC METABOLIC PANEL
Anion gap: 24 — ABNORMAL HIGH (ref 5–15)
BUN: 15 mg/dL (ref 6–20)
CO2: 14 mmol/L — ABNORMAL LOW (ref 22–32)
Calcium: 9 mg/dL (ref 8.9–10.3)
Chloride: 100 mmol/L (ref 98–111)
Creatinine, Ser: 1.37 mg/dL — ABNORMAL HIGH (ref 0.61–1.24)
GFR calc Af Amer: >60 mL/min (ref >60)
GFR calc non Af Amer: >60 mL/min (ref >60)
Glucose, Bld: 275 mg/dL — ABNORMAL HIGH (ref 70–99)
Potassium: 3.8 mmol/L (ref 3.5–5.1)
Sodium: 138 mmol/L (ref 135–145)

## 2019-02-11 LAB — URINE DRUG SCREEN, QUALITATIVE (ARMC ONLY)
Amphetamines, Ur Screen: NOT DETECTED
Barbiturates, Ur Screen: NOT DETECTED
Benzodiazepine, Ur Scrn: POSITIVE — AB
Cannabinoid 50 Ng, Ur ~~LOC~~: POSITIVE — AB
Cocaine Metabolite,Ur ~~LOC~~: NOT DETECTED
MDMA (Ecstasy)Ur Screen: NOT DETECTED
Methadone Scn, Ur: NOT DETECTED
Opiate, Ur Screen: NOT DETECTED
Phencyclidine (PCP) Ur S: NOT DETECTED
Tricyclic, Ur Screen: NOT DETECTED

## 2019-02-11 MED ORDER — IBUPROFEN 600 MG PO TABS
600.0000 mg | ORAL_TABLET | Freq: Once | ORAL | Status: AC
  Administered 2019-02-11: 20:00:00 600 mg via ORAL
  Filled 2019-02-11: qty 1

## 2019-02-11 MED ORDER — LAMOTRIGINE 100 MG PO TABS: 100.0000 mg | ORAL_TABLET | Freq: Two times a day (BID) | ORAL | 2 refills | Status: DC

## 2019-02-11 NOTE — Discharge Instructions (Signed)
As we discussed do not drive, go on a roof, in a pool or any activity that would be dangerous to yourself or others if you were to have a seizure.

## 2019-02-11 NOTE — ED Provider Notes (Signed)
Patient awake alert oriented looks well we will send him home.  He has been stable.   Nena Polio, MD 02/11/19 2225

## 2019-02-11 NOTE — ED Provider Notes (Signed)
[facility] Emergency Department Provider Note  ____________________________________________   I have reviewed the triage vital signs and the nursing notes.   HISTORY  Chief Complaint Drug Overdose   History limited by: Altered Mental Status   HPI Timothy Cole is a 23 y.o. male who presents to the emergency department today after apparent overdose.  Apparently family called EMS and they found the patient down on the ground.  First responders did arrive and administered Narcan after patient was found snoring and with pinpoint pupils.  He received a total of 2-1/2 mg of Narcan.  EMS states that after a minute or 2 he started waking up but then went into a seizure.  They then gave the patient Versed.  Patient does indicate that he has a history of seizures although denies that he is on any medications.  He denied any drug use today.   Records reviewed. History of seizures.  PMH Seizures  There are no active problems to display for this patient.   History reviewed. No pertinent surgical history.  Prior to Admission medications   Not on File    Allergies Patient has no known allergies.  History reviewed. No pertinent family history.  Social History Social History   Tobacco Use  . Smoking status: Unknown If Ever Smoked  Substance Use Topics  . Alcohol use: Not on file  . Drug use: Not on file    Review of Systems Unable to obtain reliable ROS secondary to AMS ____________________________________________   PHYSICAL EXAM:  VITAL SIGNS: ED Triage Vitals  Enc Vitals Group     BP 02/11/19 1900 124/60     Pulse Rate 02/11/19 1856 (!) 111     Resp 02/11/19 1856 12     Temp 02/11/19 1857 (!) 97.5 F (36.4 C)     Temp Source 02/11/19 1857 Oral     SpO2 02/11/19 1856 99 %     Weight 02/11/19 1856 160 lb (72.6 kg)     Height 02/11/19 1856 5\' 8"  (1.727 m)     Head Circumference --      Peak Flow --      Pain Score 02/11/19 1856 0     Constitutional: Awake and alert. Not oriented.  Eyes: Conjunctivae are normal.  ENT      Head: Normocephalic and atraumatic.      Nose: No congestion/rhinnorhea.      Mouth/Throat: Mucous membranes are moist.      Neck: No stridor. Hematological/Lymphatic/Immunilogical: No cervical lymphadenopathy. Cardiovascular: Tachycardic, regular rhythm.  No murmurs, rubs, or gallops.  Respiratory: Normal respiratory effort without tachypnea nor retractions. Breath sounds are clear and equal bilaterally. No wheezes/rales/rhonchi. Gastrointestinal: Soft and non tender. No rebound. No guarding.  Genitourinary: Deferred Musculoskeletal: Normal range of motion in all extremities. No lower extremity edema. Neurologic:  Awake and alert. Not completely oriented. Skin:  Skin is warm, dry and intact. No rash noted. ____________________________________________    LABS (pertinent positives/negatives)  BMP na 138, k 3.8, co2 14, glu 275, cr 1.37 CBC wbc 15.1, hgb 12.6, plt 250 UDS positive for cannabinoid, benzo  ____________________________________________   EKG  None  ____________________________________________    RADIOLOGY  None  ____________________________________________   PROCEDURES  Procedures  ____________________________________________   INITIAL IMPRESSION / ASSESSMENT AND PLAN / ED COURSE  Pertinent labs & imaging results that were available during my care of the patient were reviewed by me and considered in my medical decision making (see chart for details).  Patient presented to the emergency department today via EMS because of concerns for possible overdose.  Patient was given Narcan in the field and then shortly afterwards had a seizure.  On initial exam patient was awake and alert however not completely oriented.  Shortly thereafter however the patient became more a awake and alert.  He denies any drug use.  Does have history of seizures and has not been taking  any medication.  UDS was negative for opioids.  This point I wonder if patient simply was postictal.  He is noncompliant with medication.  Will  plan on observing in the emergency department for a few hours.   ____________________________________________   FINAL CLINICAL IMPRESSION(S) / ED DIAGNOSES  Final diagnoses:  Seizure [facility])     Note: This dictation was prepared with Dragon dictation. Any transcriptional errors that result from this process are unintentional     Nance Pear, MD 02/11/19 2037

## 2019-02-11 NOTE — ED Notes (Signed)
Patient told me his name and DOB. Timothy Cole 06/17/1996.

## 2019-02-11 NOTE — ED Notes (Signed)
Patients mother in room 

## 2019-02-11 NOTE — ED Notes (Signed)
[place] alarm, yellow armband and socks on.

## 2019-02-11 NOTE — ED Triage Notes (Signed)
Pt arrived EMS.  Family called EMS after finding pt slumped over by car.  Pt found with sats in 60s, low RR.  2mg  nasal narcan, 0.5 mg IV narcan given PTA.  Pt then seized and 2mg  IV versed given.  Pt woke confused and combative and pulled out IV from EMS.  Pt lethargic and falling asleep.  Unable to obtain reliable medical allergies, history or name/DOB from pt.  Pt VSS at this time and maintaining airway.

## 2019-02-14 ENCOUNTER — Encounter: Payer: Self-pay | Admitting: Emergency Medicine

## 2019-05-27 ENCOUNTER — Other Ambulatory Visit: Payer: Self-pay

## 2019-05-27 ENCOUNTER — Inpatient Hospital Stay
Admission: EM | Admit: 2019-05-27 | Discharge: 2019-05-28 | DRG: 637 | Disposition: A | Discharge status: Home / Self Care | Payer: BLUE CROSS/BLUE SHIELD | Attending: Internal Medicine | Admitting: Internal Medicine

## 2019-05-27 DIAGNOSIS — G9341 Metabolic encephalopathy: Secondary | ICD-10-CM | POA: Diagnosis present

## 2019-05-27 DIAGNOSIS — F319 Bipolar disorder, unspecified: Secondary | ICD-10-CM | POA: Diagnosis present

## 2019-05-27 DIAGNOSIS — E11649 Type 2 diabetes mellitus with hypoglycemia without coma: Secondary | ICD-10-CM | POA: Diagnosis present

## 2019-05-27 DIAGNOSIS — T50904A Poisoning by unspecified drugs, medicaments and biological substances, undetermined, initial encounter: Secondary | ICD-10-CM | POA: Diagnosis present

## 2019-05-27 DIAGNOSIS — Z9114 Patient's other noncompliance with medication regimen: Secondary | ICD-10-CM

## 2019-05-27 DIAGNOSIS — Z79899 Other long term (current) drug therapy: Secondary | ICD-10-CM

## 2019-05-27 DIAGNOSIS — F17210 Nicotine dependence, cigarettes, uncomplicated: Secondary | ICD-10-CM | POA: Diagnosis present

## 2019-05-27 DIAGNOSIS — E1110 Type 2 diabetes mellitus with ketoacidosis without coma: Principal | ICD-10-CM | POA: Diagnosis present

## 2019-05-27 DIAGNOSIS — N179 Acute kidney failure, unspecified: Secondary | ICD-10-CM | POA: Diagnosis present

## 2019-05-27 DIAGNOSIS — G40909 Epilepsy, unspecified, not intractable, without status epilepticus: Secondary | ICD-10-CM

## 2019-05-27 DIAGNOSIS — R569 Unspecified convulsions: Secondary | ICD-10-CM

## 2019-05-27 DIAGNOSIS — D509 Iron deficiency anemia, unspecified: Secondary | ICD-10-CM | POA: Diagnosis present

## 2019-05-27 DIAGNOSIS — Z20828 Contact with and (suspected) exposure to other viral communicable diseases: Secondary | ICD-10-CM | POA: Diagnosis present

## 2019-05-27 DIAGNOSIS — Z91048 Other nonmedicinal substance allergy status: Secondary | ICD-10-CM

## 2019-05-27 DIAGNOSIS — G40901 Epilepsy, unspecified, not intractable, with status epilepticus: Secondary | ICD-10-CM | POA: Diagnosis present

## 2019-05-27 DIAGNOSIS — R0681 Apnea, not elsewhere classified: Secondary | ICD-10-CM | POA: Diagnosis present

## 2019-05-27 DIAGNOSIS — Z91013 Allergy to seafood: Secondary | ICD-10-CM

## 2019-05-27 LAB — CBC WITH DIFFERENTIAL/PLATELET
Abs Immature Granulocytes: 0.6 10*3/uL — ABNORMAL HIGH (ref 0.00–0.07)
Basophils Absolute: 0.1 10*3/uL (ref 0.0–0.1)
Basophils Relative: 0 %
Eosinophils Absolute: 0.1 10*3/uL (ref 0.0–0.5)
Eosinophils Relative: 0 %
HCT: 41.5 % (ref 39.0–52.0)
Hemoglobin: 12.4 g/dL — ABNORMAL LOW (ref 13.0–17.0)
Immature Granulocytes: 4 %
Lymphocytes Relative: 28 %
Lymphs Abs: 4.8 10*3/uL — ABNORMAL HIGH (ref 0.7–4.0)
MCH: 23.4 pg — ABNORMAL LOW (ref 26.0–34.0)
MCHC: 29.9 g/dL — ABNORMAL LOW (ref 30.0–36.0)
MCV: 78.4 fL — ABNORMAL LOW (ref 80.0–100.0)
Monocytes Absolute: 1 10*3/uL (ref 0.1–1.0)
Monocytes Relative: 6 %
Neutro Abs: 10.7 10*3/uL — ABNORMAL HIGH (ref 1.7–7.7)
Neutrophils Relative %: 62 %
Platelets: 238 10*3/uL (ref 150–400)
RBC: 5.29 MIL/uL (ref 4.22–5.81)
RDW: 14.1 % (ref 11.5–15.5)
WBC: 17.2 10*3/uL — ABNORMAL HIGH (ref 4.0–10.5)
nRBC: 0 % (ref 0.0–0.2)

## 2019-05-27 LAB — GLUCOSE, CAPILLARY: Glucose-Capillary: 260 mg/dL — ABNORMAL HIGH (ref 70–99)

## 2019-05-27 MED ORDER — LORAZEPAM 2 MG/ML IJ SOLN
2.0000 mg | Freq: Once | INTRAMUSCULAR | Status: AC
  Administered 2019-05-27: 2 mg via INTRAVENOUS

## 2019-05-27 MED ORDER — SODIUM CHLORIDE 0.9 % IV SOLN
Freq: Once | INTRAVENOUS | Status: AC
  Administered 2019-05-27: via INTRAVENOUS

## 2019-05-27 NOTE — ED Notes (Addendum)
Seizure pads in place, pt wearing yellow fall socks and bracelet.  Fall alarm activated.

## 2019-05-27 NOTE — ED Triage Notes (Addendum)
Pt arrived via [facility], barely breathing when they arrived, 2-3 breaths/min, 40s, bagging, narcan 2mg , then 2mg  more, breathing better then seized on stretcher.  Hx of seizures and drug abuse.  4 mg versed for seizures in 2 mg doses, cbg 307 with no hx of diabetes.  250 mL of NS by EMS.  Dr. Alfred Levins at bedside upon pt's arrival to ED.

## 2019-05-28 ENCOUNTER — Emergency Department: Payer: BLUE CROSS/BLUE SHIELD

## 2019-05-28 ENCOUNTER — Other Ambulatory Visit: Payer: Self-pay

## 2019-05-28 ENCOUNTER — Encounter: Payer: Self-pay | Admitting: Family Medicine

## 2019-05-28 DIAGNOSIS — Z91013 Allergy to seafood: Secondary | ICD-10-CM | POA: Diagnosis not present

## 2019-05-28 DIAGNOSIS — R569 Unspecified convulsions: Secondary | ICD-10-CM

## 2019-05-28 DIAGNOSIS — T50904A Poisoning by unspecified drugs, medicaments and biological substances, undetermined, initial encounter: Secondary | ICD-10-CM | POA: Diagnosis present

## 2019-05-28 DIAGNOSIS — E1110 Type 2 diabetes mellitus with ketoacidosis without coma: Secondary | ICD-10-CM | POA: Diagnosis not present

## 2019-05-28 DIAGNOSIS — Z91048 Other nonmedicinal substance allergy status: Secondary | ICD-10-CM | POA: Diagnosis not present

## 2019-05-28 DIAGNOSIS — Z9119 Patient's noncompliance with other medical treatment and regimen: Secondary | ICD-10-CM

## 2019-05-28 DIAGNOSIS — E1310 Other specified diabetes mellitus with ketoacidosis without coma: Secondary | ICD-10-CM | POA: Diagnosis not present

## 2019-05-28 DIAGNOSIS — N189 Chronic kidney disease, unspecified: Secondary | ICD-10-CM

## 2019-05-28 DIAGNOSIS — N179 Acute kidney failure, unspecified: Secondary | ICD-10-CM | POA: Diagnosis not present

## 2019-05-28 DIAGNOSIS — E11649 Type 2 diabetes mellitus with hypoglycemia without coma: Secondary | ICD-10-CM | POA: Diagnosis present

## 2019-05-28 DIAGNOSIS — F319 Bipolar disorder, unspecified: Secondary | ICD-10-CM | POA: Diagnosis present

## 2019-05-28 DIAGNOSIS — G40901 Epilepsy, unspecified, not intractable, with status epilepticus: Secondary | ICD-10-CM | POA: Diagnosis not present

## 2019-05-28 DIAGNOSIS — Z20828 Contact with and (suspected) exposure to other viral communicable diseases: Secondary | ICD-10-CM | POA: Diagnosis present

## 2019-05-28 DIAGNOSIS — Z9114 Patient's other noncompliance with medication regimen: Secondary | ICD-10-CM | POA: Diagnosis not present

## 2019-05-28 DIAGNOSIS — R0681 Apnea, not elsewhere classified: Secondary | ICD-10-CM | POA: Diagnosis present

## 2019-05-28 DIAGNOSIS — F17210 Nicotine dependence, cigarettes, uncomplicated: Secondary | ICD-10-CM | POA: Diagnosis present

## 2019-05-28 DIAGNOSIS — G9341 Metabolic encephalopathy: Secondary | ICD-10-CM | POA: Diagnosis present

## 2019-05-28 DIAGNOSIS — Z79899 Other long term (current) drug therapy: Secondary | ICD-10-CM | POA: Diagnosis not present

## 2019-05-28 DIAGNOSIS — D509 Iron deficiency anemia, unspecified: Secondary | ICD-10-CM | POA: Diagnosis not present

## 2019-05-28 LAB — IRON AND TIBC
Iron: 12 ug/dL — ABNORMAL LOW (ref 45–182)
Saturation Ratios: 4 % — ABNORMAL LOW (ref 17.9–39.5)
TIBC: 289 ug/dL (ref 250–450)
UIBC: 277 ug/dL

## 2019-05-28 LAB — COMPREHENSIVE METABOLIC PANEL
ALT: 15 U/L (ref 0–44)
AST: 34 U/L (ref 15–41)
Albumin: 4.4 g/dL (ref 3.5–5.0)
Alkaline Phosphatase: 48 U/L (ref 38–126)
Anion gap: 28 — ABNORMAL HIGH (ref 5–15)
BUN: 15 mg/dL (ref 6–20)
CO2: 11 mmol/L — ABNORMAL LOW (ref 22–32)
Calcium: 8.7 mg/dL — ABNORMAL LOW (ref 8.9–10.3)
Chloride: 99 mmol/L (ref 98–111)
Creatinine, Ser: 1.71 mg/dL — ABNORMAL HIGH (ref 0.61–1.24)
GFR calc Af Amer: >60 mL/min (ref >60)
GFR calc non Af Amer: 55 mL/min — ABNORMAL LOW (ref >60)
Glucose, Bld: 281 mg/dL — ABNORMAL HIGH (ref 70–99)
Potassium: 3.8 mmol/L (ref 3.5–5.1)
Sodium: 138 mmol/L (ref 135–145)
Total Bilirubin: 0.7 mg/dL (ref 0.3–1.2)
Total Protein: 7.7 g/dL (ref 6.5–8.1)

## 2019-05-28 LAB — BASIC METABOLIC PANEL
Anion gap: 9 (ref 5–15)
BUN: 14 mg/dL (ref 6–20)
CO2: 24 mmol/L (ref 22–32)
Calcium: 8 mg/dL — ABNORMAL LOW (ref 8.9–10.3)
Chloride: 108 mmol/L (ref 98–111)
Creatinine, Ser: 1.36 mg/dL — ABNORMAL HIGH (ref 0.61–1.24)
GFR calc Af Amer: >60 mL/min (ref >60)
GFR calc non Af Amer: >60 mL/min (ref >60)
Glucose, Bld: 69 mg/dL — ABNORMAL LOW (ref 70–99)
Potassium: 3.9 mmol/L (ref 3.5–5.1)
Sodium: 141 mmol/L (ref 135–145)

## 2019-05-28 LAB — GLUCOSE, CAPILLARY
Glucose-Capillary: 61 mg/dL — ABNORMAL LOW (ref 70–99)
Glucose-Capillary: 64 mg/dL — ABNORMAL LOW (ref 70–99)
Glucose-Capillary: 81 mg/dL (ref 70–99)
Glucose-Capillary: 89 mg/dL (ref 70–99)

## 2019-05-28 LAB — URINE DRUG SCREEN, QUALITATIVE (ARMC ONLY)
Amphetamines, Ur Screen: NOT DETECTED
Barbiturates, Ur Screen: NOT DETECTED
Benzodiazepine, Ur Scrn: POSITIVE — AB
Cannabinoid 50 Ng, Ur ~~LOC~~: POSITIVE — AB
Cocaine Metabolite,Ur ~~LOC~~: NOT DETECTED
MDMA (Ecstasy)Ur Screen: NOT DETECTED
Methadone Scn, Ur: NOT DETECTED
Opiate, Ur Screen: NOT DETECTED
Phencyclidine (PCP) Ur S: NOT DETECTED
Tricyclic, Ur Screen: NOT DETECTED

## 2019-05-28 LAB — CBC
HCT: 36.7 % — ABNORMAL LOW (ref 39.0–52.0)
Hemoglobin: 11.6 g/dL — ABNORMAL LOW (ref 13.0–17.0)
MCH: 23.4 pg — ABNORMAL LOW (ref 26.0–34.0)
MCHC: 31.6 g/dL (ref 30.0–36.0)
MCV: 74.1 fL — ABNORMAL LOW (ref 80.0–100.0)
Platelets: 159 10*3/uL (ref 150–400)
RBC: 4.95 MIL/uL (ref 4.22–5.81)
RDW: 14.3 % (ref 11.5–15.5)
WBC: 19.6 10*3/uL — ABNORMAL HIGH (ref 4.0–10.5)
nRBC: 0 % (ref 0.0–0.2)

## 2019-05-28 LAB — ETHANOL: Alcohol, Ethyl (B): <10 mg/dL (ref <10)

## 2019-05-28 LAB — SODIUM, URINE, RANDOM: Sodium, Ur: 131 mmol/L

## 2019-05-28 LAB — BETA-HYDROXYBUTYRIC ACID: Beta-Hydroxybutyric Acid: 1.03 mmol/L — ABNORMAL HIGH (ref 0.05–0.27)

## 2019-05-28 LAB — CREATININE, URINE, RANDOM: Creatinine, Urine: 157 mg/dL

## 2019-05-28 LAB — TROPONIN I (HIGH SENSITIVITY): Troponin I (High Sensitivity): 15 ng/L (ref <18)

## 2019-05-28 LAB — LACTIC ACID, PLASMA: Lactic Acid, Venous: 1.2 mmol/L (ref 0.5–1.9)

## 2019-05-28 LAB — SARS CORONAVIRUS 2 (TAT 6-24 HRS): SARS Coronavirus 2: NEGATIVE

## 2019-05-28 LAB — HIV ANTIBODY (ROUTINE TESTING W REFLEX): HIV Screen 4th Generation wRfx: NONREACTIVE

## 2019-05-28 LAB — SALICYLATE LEVEL: Salicylate Lvl: <7 mg/dL (ref 2.8–30.0)

## 2019-05-28 LAB — HEMOGLOBIN A1C
Hgb A1c MFr Bld: 5.5 % (ref 4.8–5.6)
Mean Plasma Glucose: 111.15 mg/dL

## 2019-05-28 LAB — FERRITIN: Ferritin: 118 ng/mL (ref 24–336)

## 2019-05-28 LAB — ACETAMINOPHEN LEVEL: Acetaminophen (Tylenol), Serum: <10 ug/mL — ABNORMAL LOW (ref 10–30)

## 2019-05-28 MED ORDER — INSULIN REGULAR(HUMAN) IN NACL 100-0.9 UT/100ML-% IV SOLN
INTRAVENOUS | Status: DC
  Filled 2019-05-28: qty 100

## 2019-05-28 MED ORDER — INFLUENZA VAC SPLIT QUAD 0.5 ML IM SUSY
0.5000 mL | PREFILLED_SYRINGE | INTRAMUSCULAR | Status: DC
  Filled 2019-05-28: qty 0.5

## 2019-05-28 MED ORDER — LORAZEPAM 2 MG/ML IJ SOLN
2.0000 mg | Freq: Once | INTRAMUSCULAR | Status: AC
  Administered 2019-05-28: 2 mg via INTRAVENOUS

## 2019-05-28 MED ORDER — ENOXAPARIN SODIUM 40 MG/0.4ML ~~LOC~~ SOLN
40.0000 mg | SUBCUTANEOUS | Status: DC
  Filled 2019-05-28: qty 0.4

## 2019-05-28 MED ORDER — LAMOTRIGINE 100 MG PO TABS
100.0000 mg | ORAL_TABLET | Freq: Two times a day (BID) | ORAL | Status: DC
  Administered 2019-05-28: 100 mg via ORAL
  Filled 2019-05-28 (×2): qty 1

## 2019-05-28 MED ORDER — DEXTROSE-NACL 5-0.45 % IV SOLN
INTRAVENOUS | Status: AC
  Administered 2019-05-28: 05:00:00 via INTRAVENOUS

## 2019-05-28 MED ORDER — SODIUM CHLORIDE 0.9 % IV BOLUS
1000.0000 mL | Freq: Once | INTRAVENOUS | Status: AC
  Administered 2019-05-28: 1000 mL via INTRAVENOUS

## 2019-05-28 MED ORDER — ONDANSETRON HCL 4 MG PO TABS: 4.0000 mg | ORAL_TABLET | Freq: Four times a day (QID) | ORAL | Status: DC | PRN

## 2019-05-28 MED ORDER — POTASSIUM CHLORIDE 10 MEQ/100ML IV SOLN
10.0000 meq | INTRAVENOUS | Status: DC
  Filled 2019-05-28 (×2): qty 100

## 2019-05-28 MED ORDER — ACETAMINOPHEN 325 MG PO TABS
650.0000 mg | ORAL_TABLET | Freq: Four times a day (QID) | ORAL | Status: DC | PRN
  Administered 2019-05-28: 650 mg via ORAL
  Filled 2019-05-28: qty 2

## 2019-05-28 MED ORDER — SODIUM CHLORIDE 0.9 % IV SOLN
INTRAVENOUS | Status: DC
  Administered 2019-05-28: 04:00:00 via INTRAVENOUS

## 2019-05-28 MED ORDER — SODIUM CHLORIDE 0.9 % IV SOLN
2000.0000 mg | Freq: Once | INTRAVENOUS | Status: AC
  Administered 2019-05-28: 2000 mg via INTRAVENOUS
  Filled 2019-05-28: qty 20

## 2019-05-28 MED ORDER — LORAZEPAM 2 MG/ML IJ SOLN: 2.0000 mg | Freq: Once | INTRAMUSCULAR | Status: DC | PRN

## 2019-05-28 MED ORDER — ONDANSETRON HCL 4 MG/2ML IJ SOLN: 4.0000 mg | Freq: Four times a day (QID) | INTRAMUSCULAR | Status: DC | PRN

## 2019-05-28 MED ORDER — DEXTROSE 50 % IV SOLN: 0.0000 mL | INTRAVENOUS | Status: DC | PRN

## 2019-05-28 NOTE — ED Notes (Signed)
Report received from [place], [place]. Pt is currently resting comfortably in the ED stretcher. ED stretcher locked and in lowest position. Introduced self to pt. Family sitting at bedside at this time. Pt has no complaints and NAD at this time. Pt is being cardiac monitoring. V/S WNL. Will continue to monitor. Call bell within reach.

## 2019-05-28 NOTE — ED Notes (Signed)
Pt's CBG at 4:18am found to be 61mg /dL; recheck 64 mg/dL. Admitting MD made aware and orders for IV Insulin, NS, and K+ will be d/c'd; order for D5 0.45% NS @ 35mL to remain active.

## 2019-05-28 NOTE — Consult Note (Signed)
Reason for Consult:seizure activity  Referring Physician: Dr. Marylu LundAmery   CC: seizures  HPI: Timothy Cole is an 23 y.o. male with hx seizures lat one past August, noncompliant with AEDs who presents with unresponsive episode. Patient was on lamictal BID but states was not taking it.  EMS were called by family due to the patient being unresponsive.  On arrival he was apneic and satting 40% on room air, bagging given Narcan and seemed to have some improvement. En route, he had 2 tonic clonic seizures, aborted with Versed.   There is potential use of illicit substance/pills.  In ED was given Ativan and loaded with Keppra. Currently waking up, following commands and is post ictal.    Past Medical History:  Diagnosis Date  . Abdominal pain, recurrent   . Allergy    Dust  . Anxiety   . Drug abuse (H[facility])   . Hematemesis   . Seizures ([facility])     Past Surgical History:  Procedure Laterality Date  . ESOPHAGOGASTRODUODENOSCOPY  09/19/2011   Procedure: ESOPHAGOGASTRODUODENOSCOPY (EGD);  Surgeon: Jon GillsJoseph H Clark, MD;  [facility];  Service: Gastroenterology;  Laterality: N/A;  . KNEE SURGERY Left     Family History  Problem Relation Age of Onset  . Cholelithiasis Maternal Aunt   . Ulcers Maternal Grandmother   . Birth defects Maternal Grandmother   . Diabetes Maternal Grandmother   . Diabetes Maternal Grandfather   . Heart disease Maternal Grandfather   . Hyperlipidemia Maternal Grandfather   . Hypertension Maternal Grandfather   . Pancreatic cancer Maternal Grandfather   . Hypertension Mother   . Other Father        unsure of history    Social History:  reports that he has been smoking cigarettes. He has been smoking about 1.00 pack per day. He has never used smokeless tobacco. He reports that he does not drink alcohol or use drugs.  Allergies  Allergen Reactions  . Shellfish Allergy Nausea And Vomiting    Medications: I have reviewed the patient's current  medications.  ROS: Sleepy to give good ROS  Physical Examination: Blood pressure 123/65, pulse 63, temperature (!) 97.5 F (36.4 C), temperature source Axillary, resp. rate 17, height 6' (1.829 m), weight 70 kg, SpO2 97 %.    Neurological Examination   Mental Status: Alert, oriented, to name and location  Cranial Nerves: II: Discs flat bilaterally; Visual fields grossly normal, pupils equal, round, reactive to light and accommodation III,IV, VI: ptosis not present, extra-ocular motions intact bilaterally V,VII: smile symmetric, facial light touch sensation normal bilaterally VIII: hearing normal bilaterally IX,X: gag reflex present XI: bilateral shoulder shrug XII: midline tongue extension Motor: Generalized weakness bilaterally.  Tone and bulk:normal tone throughout; no atrophy noted Sensory: Pinprick and light touch intact throughout, bilaterally Deep Tendon Reflexes: 1+ and symmetric throughout Plantars: Right: downgoing   Left: downgoing Cerebellar: normal finger-to-nose     Laboratory Studies:   Basic Metabolic Panel: Recent Labs  Lab 05/27/19 2343 05/28/19 0432  NA 138 141  K 3.8 3.9  CL 99 108  CO2 11* 24  GLUCOSE 281* 69*  BUN 15 14  CREATININE 1.71* 1.36*  CALCIUM 8.7* 8.0*    Liver Function Tests: Recent Labs  Lab 05/27/19 2343  AST 34  ALT 15  ALKPHOS 48  BILITOT 0.7  PROT 7.7  ALBUMIN 4.4   No results for input(s): LIPASE, AMYLASE in the last 168 hours. No results for input(s): AMMONIA in the last 168  hours.  CBC: Recent Labs  Lab 05/27/19 2343 05/28/19 1100  WBC 17.2* 19.6*  NEUTROABS 10.7*  --   HGB 12.4* 11.6*  HCT 41.5 36.7*  MCV 78.4* 74.1*  PLT 238 159    Cardiac Enzymes: No results for input(s): CKTOTAL, CKMB, CKMBINDEX, TROPONINI in the last 168 hours.  BNP: Invalid input(s): POCBNP  CBG: Recent Labs  Lab 05/27/19 2336 05/28/19 0417 05/28/19 0419 05/28/19 0537 05/28/19 0730  GLUCAP 260* 61* 64* 81 89     Microbiology: Results for orders placed or performed during the hospital encounter of 05/26/16  Rapid strep screen     Status: Abnormal   Collection Time: 05/26/16  2:56 PM   Specimen: Oral Mucosa/Gingiva; Other  Result Value Ref Range Status   Streptococcus, Group A Screen (Direct) POSITIVE (A) NEGATIVE Final    Coagulation Studies: No results for input(s): LABPROT, INR in the last 72 hours.  Urinalysis: No results for input(s): COLORURINE, LABSPEC, PHURINE, GLUCOSEU, HGBUR, BILIRUBINUR, KETONESUR, PROTEINUR, UROBILINOGEN, NITRITE, LEUKOCYTESUR in the last 168 hours.  Invalid input(s): APPERANCEUR  Lipid Panel:  No results found for: CHOL, TRIG, HDL, CHOLHDL, VLDL, LDLCALC  HgbA1C: No results found for: HGBA1C  Urine Drug Screen:      Component Value Date/Time   LABOPIA NONE DETECTED 05/28/2019 0332   COCAINSCRNUR NONE DETECTED 05/28/2019 0332   LABBENZ POSITIVE (A) 05/28/2019 0332   AMPHETMU NONE DETECTED 05/28/2019 0332   THCU POSITIVE (A) 05/28/2019 0332   LABBARB NONE DETECTED 05/28/2019 0332    Alcohol Level:  Recent Labs  Lab 05/27/19 2343  ETH <10    Other results: EKG: normal EKG, normal sinus rhythm, unchanged from previous tracings.  Imaging: Ct Head Wo Contrast  Result Date: 05/28/2019 CLINICAL DATA:  Encephalopathy, seizures EXAM: CT HEAD WITHOUT CONTRAST TECHNIQUE: Contiguous axial images were obtained from the base of the skull through the vertex without intravenous contrast. COMPARISON:  CT head 08/03/2018 FINDINGS: Brain: No evidence of acute infarction, hemorrhage, hydrocephalus, extra-axial collection or mass lesion/mass effect. Vascular: No hyperdense vessel or unexpected calcification. Skull: No calvarial fracture or suspicious osseous lesion. No scalp swelling or hematoma. Sinuses/Orbits: Minimal mural disease in the ethmoids. Paranasal sinuses and mastoid air cells are otherwise predominantly clear. No air-fluid levels included orbital  structures are unremarkable. Other: None IMPRESSION: No acute intracranial abnormality. Electronically Signed   By: Kreg Shropshire M.D.   On: 05/28/2019 03:18     Assessment/Plan:  23 y.o. male with hx seizures lat one past August, noncompliant with AEDs who presents with unresponsive episode. Patient was on lamictal BID but states was not taking it.  EMS were called by family due to the patient being unresponsive.  On arrival he was apneic and satting 40% on room air, bagging given Narcan and seemed to have some improvement. En route, he had 2 tonic clonic seizures, aborted with Versed.   There is potential use of illicit substance/pills.  In ED was given Ativan and loaded with Keppra. Currently waking up, following commands and is post ictal.    - Provoked seizure in setting of non compliance with medications and possibly illicit substance use - Pt was given 2g Keppra but in this young individual will likely cause agitation so will hold further Keppra - Lamictal home dose of 100BID to continue - Improving, slightly post ictal state - CTH no acute abnormalities - Don't think there is a need for EEG any further imaging given that he is waking up, following commands and provoked seizure -  Possibly d/c later today from Neurological stand point - d/w patient and his mom at bedside.  05/28/2019, 11:51 AM

## 2019-05-28 NOTE — Discharge Summary (Signed)
Timothy Cole RJJ:[id number] DOB: 10/01/1995 DOA: 05/27/2019  PCP: Patient, No Pcp Per  Admit date: 05/27/2019 Discharge date: 05/28/2019  Admitted From: Home Disposition: Home  Recommendations for Outpatient Follow-up:  1. Follow up with PCP in 1 week 2. Please obtain BMP/CBC in one week 3. Please follow up on the following pending results: covid test pending 4. Follow-up with primary neurology next week  Home Health: None   Discharge Condition:Stable CODE STATUS: Full Diet recommendation: Regular  Brief/Interim Summary:  Timothy Cole is a 23 y.o. M with hx seizures, noncompliant with AEDs who presents with unresponsive episode. EMS were called by family due to the patient being unresponsive.  On arrival he was apneic and satting 40% on room air, bagging given Narcan and seemed to have some improvement. En route, he had 2 tonic clonic seizures, aborted with Versed.  In the ER, he had another seizure.  CT head unremarkable.  Bicarb 11, anion gap 28, glucose 260.  No previous history of diabetes.  Creatinine 1.7 from baseline 1.3, WBC 17.2K. Was given Ativan and loaded with Keppra. On day of admission, mother came home in afternoon, they argued and he took some pills he had (she thinks most likely illicit pills), then went in his room.  She heard him "banging around in there" and then shortly after went quiet, so she went to check on him and found him slumped on toilet, completely unresponsive and barely breathing, so she called EMS. Neurology was consulted.  Neurology thought this was provoked seizure in the setting of noncompliance with medications and possibly illicit substance use.  Neurology recommended Lamictal home dose of 100 twice daily to continue and they did not feel patient needs a EEG or further imaging given that he was awake and following commands and it was a provoked seizure.  From neurologic standpoint neurology was okay for patient to be discharged home.  He was  started on IV fluids and his anion gap was closed.  He was also found with acute on chronic kidney disease and improved to baseline with IV hydration.  His leukocytosis was elevated however he was afebrile and it was likely stress-induced/reactive.  He is following commands and is stable to be discharged home.  Also neurology stated to avoid giving Keppra to the young individual since it would likely cause agitation..Covid test pending.     Discharge Diagnoses:  Principal Problem:   DKA (diabetic ketoacidoses) ([place]) Active Problems:   Nonintractable epilepsy without status epilepticus ([place])   AKI (acute kidney injury) ([place])   Microcytic anemia   Seizures ([place])    Discharge Instructions  Discharge Instructions    Call MD for:  temperature >100.4   Complete by: As directed    Diet - low sodium heart healthy   Complete by: As directed    Discharge instructions   Complete by: As directed    Need to be compliant with medications. Follow-up with neurology next week   Increase activity slowly   Complete by: As directed      Allergies as of 05/28/2019      Reactions   Shellfish Allergy Nausea And Vomiting      Medication List    TAKE these medications   citalopram 20 MG tablet Commonly known as: CELEXA Take 20 mg by mouth daily.   famotidine 40 MG tablet Commonly known as: PEPCID Take 1 tablet (40 mg total) by mouth every evening.   hydrOXYzine 25 MG capsule Commonly known as: VISTARIL Take  25 mg by mouth 2 (two) times daily as needed.   lamoTRIgine 100 MG tablet Commonly known as: LaMICtal Take 1 tablet (100 mg total) by mouth 2 (two) times daily.   risperiDONE 2 MG tablet Commonly known as: RISPERDAL Take 2 mg by mouth at bedtime.       Allergies  Allergen Reactions  . Shellfish Allergy Nausea And Vomiting    Consultations:     Procedures/Studies: Ct Head Wo Contrast  Result Date: 05/28/2019 CLINICAL DATA:  Encephalopathy, seizures EXAM: CT HEAD  WITHOUT CONTRAST TECHNIQUE: Contiguous axial images were obtained from the base of the skull through the vertex without intravenous contrast. COMPARISON:  CT head 08/03/2018 FINDINGS: Brain: No evidence of acute infarction, hemorrhage, hydrocephalus, extra-axial collection or mass lesion/mass effect. Vascular: No hyperdense vessel or unexpected calcification. Skull: No calvarial fracture or suspicious osseous lesion. No scalp swelling or hematoma. Sinuses/Orbits: Minimal mural disease in the ethmoids. Paranasal sinuses and mastoid air cells are otherwise predominantly clear. No air-fluid levels included orbital structures are unremarkable. Other: None IMPRESSION: No acute intracranial abnormality. Electronically Signed   By: Kreg Shropshire M.D.   On: 05/28/2019 03:18       Subjective: Pt has no complaints of fever, chills, abd pain, or any other complaints.  Discharge Exam: Vitals:   05/28/19 0730 05/28/19 1056  BP: 104/70 123/65  Pulse: 71 63  Resp: 18 17  Temp:    SpO2: 100% 97%   Vitals:   05/28/19 0530 05/28/19 0600 05/28/19 0730 05/28/19 1056  BP: 103/62 108/62 104/70 123/65  Pulse: 82 75 71 63  Resp: 13 12 18 17   Temp:      TempSrc:      SpO2: 93% 95% 100% 97%  Weight:      Height:        General: Pt is alert, awake, not in acute distress, AAxOx3, cooperative with exam Cardiovascular: RRR, S1/S2 +, no rubs, no gallops Respiratory: CTA bilaterally, no wheezing, no rhonchi Abdominal: Soft, NT, ND, bowel sounds + Extremities: no edema, no cyanosis Neurologic: no focal deficits    The results of significant diagnostics from this hospitalization (including imaging, microbiology, ancillary and laboratory) are listed below for reference.     Microbiology: No results found for this or any previous visit (from the past 240 hour(s)).   Labs: BNP (last 3 results) No results for input(s): BNP in the last 8760 hours. Basic Metabolic Panel: Recent Labs  Lab 05/27/19 2343  05/28/19 0432  NA 138 141  K 3.8 3.9  CL 99 108  CO2 11* 24  GLUCOSE 281* 69*  BUN 15 14  CREATININE 1.71* 1.36*  CALCIUM 8.7* 8.0*   Liver Function Tests: Recent Labs  Lab 05/27/19 2343  AST 34  ALT 15  ALKPHOS 48  BILITOT 0.7  PROT 7.7  ALBUMIN 4.4   No results for input(s): LIPASE, AMYLASE in the last 168 hours. No results for input(s): AMMONIA in the last 168 hours. CBC: Recent Labs  Lab 05/27/19 2343 05/28/19 1100  WBC 17.2* 19.6*  NEUTROABS 10.7*  --   HGB 12.4* 11.6*  HCT 41.5 36.7*  MCV 78.4* 74.1*  PLT 238 159   Cardiac Enzymes: No results for input(s): CKTOTAL, CKMB, CKMBINDEX, TROPONINI in the last 168 hours. BNP: Invalid input(s): POCBNP CBG: Recent Labs  Lab 05/27/19 2336 05/28/19 0417 05/28/19 0419 05/28/19 0537 05/28/19 0730  GLUCAP 260* 61* 64* 81 89   D-Dimer No results for input(s): DDIMER in the last 72  hours. Hgb A1c No results for input(s): HGBA1C in the last 72 hours. Lipid Profile No results for input(s): CHOL, HDL, LDLCALC, TRIG, CHOLHDL, LDLDIRECT in the last 72 hours. Thyroid function studies No results for input(s): TSH, T4TOTAL, T3FREE, THYROIDAB in the last 72 hours.  Invalid input(s): FREET3 Anemia work up Recent Labs    05/28/19 1100  FERRITIN 118  TIBC 289  IRON 12*   Urinalysis    Component Value Date/Time   COLORURINE AMBER (A) 01/22/2017 2206   APPEARANCEUR CLEAR (A) 01/22/2017 2206   LABSPEC 1.039 (H) 01/22/2017 2206   PHURINE 5.0 01/22/2017 2206   GLUCOSEU NEGATIVE 01/22/2017 2206   HGBUR NEGATIVE 01/22/2017 2206   BILIRUBINUR NEGATIVE 01/22/2017 2206   KETONESUR 5 (A) 01/22/2017 2206   PROTEINUR 30 (A) 01/22/2017 2206   NITRITE NEGATIVE 01/22/2017 2206   LEUKOCYTESUR NEGATIVE 01/22/2017 2206   Sepsis Labs Invalid input(s): PROCALCITONIN,  WBC,  LACTICIDVEN Microbiology No results found for this or any previous visit (from the past 240 hour(s)).   Time coordinating discharge: Over 30  minutes  SIGNED:   Lynn ItoSahar Mykelti Goldenstein, MD  [facility] 05/28/2019, 1:18 PM Pager   If 7PM-7AM, please contact night-coverage [email or web address] Password [email or web address]

## 2019-05-28 NOTE — ED Notes (Signed)
Supplemental O2 decreased from 4L via [place] to 2L at this time to assess pt's ability to maintain adequate O2 sats at a lower concentration.

## 2019-05-28 NOTE — ED Provider Notes (Signed)
[facility] Emergency Department Provider Note  ____________________________________________  Time seen: Approximately 12:30 AM  I have reviewed the triage vital signs and the nursing notes.   HISTORY  Chief Complaint Drug Overdose and Seizures  Level 5 caveat:  Portions of the history and physical were unable to be obtained due to post-ictal   HPI Timothy Cole is a 23 y.o. male with history of drug abuse and seizure disorder who presents as an emergency traffic for possible opiate overdose followed by seizure.  EMS was called  for unresponsiveness.  Upon arrival patient was breathing 2-3 times a minute satting 40% on room air.  He was bagged and given 2 mg of Narcan x 2.  His breathing status improved however patient then had a seizure.  He received 4 mg of Versed.  He has a history of known seizures and medication noncompliance.  Patient arrives postictal and unable to provide any history. CBG normal per EMS. Patient found to have several Klonopin pills in his pocket.   Past Medical History:  Diagnosis Date  . Abdominal pain, recurrent   . Allergy    Dust  . Anxiety   . Drug abuse (HCC)   . Hematemesis   . Seizures [facility])     Patient Active Problem List   Diagnosis Date Noted  . Nonintractable epilepsy without status epilepticus (HCC) 01/19/2018  . Epigastric abdominal pain   . Hematemesis     Past Surgical History:  Procedure Laterality Date  . ESOPHAGOGASTRODUODENOSCOPY  09/19/2011   Procedure: ESOPHAGOGASTRODUODENOSCOPY (EGD);  Surgeon: Jon Gills, MD;  Location: [facility] OR;  Service: Gastroenterology;  Laterality: N/A;  . KNEE SURGERY Left     Prior to Admission medications   Medication Sig Start Date End Date Taking? Authorizing Provider  citalopram (CELEXA) 10 MG tablet Take 10 mg by mouth daily.    [provider]  famotidine (PEPCID) 40 MG tablet Take 1 tablet (40 mg total) by mouth every evening. 01/23/17 01/23/18  Schaevitz,  Myra Rude, MD  lamoTRIgine (LAMICTAL) 100 MG tablet Take 1 tablet (100 mg total) by mouth 2 (two) times daily. 12/09/18   Irean Hong, MD  lamoTRIgine (LAMICTAL) 100 MG tablet Take 1 tablet (100 mg total) by mouth 2 (two) times daily. 02/11/19 02/11/20  Phineas Semen, MD    Allergies Shellfish allergy  Family History  Problem Relation Age of Onset  . Cholelithiasis Maternal Aunt   . Ulcers Maternal Grandmother   . Birth defects Maternal Grandmother   . Diabetes Maternal Grandmother   . Diabetes Maternal Grandfather   . Heart disease Maternal Grandfather   . Hyperlipidemia Maternal Grandfather   . Hypertension Maternal Grandfather   . Pancreatic cancer Maternal Grandfather   . Hypertension Mother   . Other Father        unsure of history    Social History Social History   Tobacco Use  . Smoking status: Current Some Day Smoker  . Smokeless tobacco: Never Used  Substance Use Topics  . Alcohol use: No  . Drug use: No    Review of Systems  Constitutional: Negative for fever. Respiratory: + respiratory depression Neurological: + seizure ____________________________________________   PHYSICAL EXAM:  VITAL SIGNS: ED Triage Vitals  Enc Vitals Group     BP 05/27/19 2334 133/87     Pulse Rate 05/27/19 2334 (!) 119     Resp 05/28/19 0000 (!) 26     Temp 05/28/19 0000 (!) 97.5 F (36.4  C)     Temp Source 05/28/19 0000 Axillary     SpO2 05/27/19 2334 100 %     Weight 05/27/19 2338 154 lb 5.2 oz (70 kg)     Height 05/27/19 2338 6' (1.829 m)     Head Circumference --      Peak Flow --      Pain Score 05/28/19 0025 Asleep     Pain Loc --      Pain Edu? --      Excl. in [place]? --     Constitutional: Combative, post-ictal, unable to answer to questions HEENT:      Head: Normocephalic and atraumatic.         Eyes: Conjunctivae are normal. Sclera is non-icteric.       Mouth/Throat: Mucous membranes are dry.       Neck: Supple with no signs of meningismus.  Cardiovascular: Tachycardic with regular rhythm. No murmurs, gallops, or rubs. 2+ symmetrical distal pulses are present in all extremities. No JVD. Respiratory: Normal respiratory effort. Lungs are clear to auscultation bilaterally. No wheezes, crackles, or rhonchi.  Gastrointestinal: Soft, and non distended Musculoskeletal: No edema, cyanosis, or erythema of extremities. No deformities Neurologic: Combative, face is symmetric, moving all extremities Skin: Skin is warm, dry and intact. No rash noted.  ____________________________________________   LABS (all labs ordered are listed, but only abnormal results are displayed)  Labs Reviewed  GLUCOSE, CAPILLARY - Abnormal; Notable for the following components:      Result Value   Glucose-Capillary 260 (*)    All other components within normal limits  CBC WITH DIFFERENTIAL/PLATELET - Abnormal; Notable for the following components:   WBC 17.2 (*)    Hemoglobin 12.4 (*)    MCV 78.4 (*)    MCH 23.4 (*)    MCHC 29.9 (*)    Neutro Abs 10.7 (*)    Lymphs Abs 4.8 (*)    Abs Immature Granulocytes 0.60 (*)    All other components within normal limits  COMPREHENSIVE METABOLIC PANEL - Abnormal; Notable for the following components:   CO2 11 (*)    Glucose, Bld 281 (*)    Creatinine, Ser 1.71 (*)    Calcium 8.7 (*)    GFR calc non Af Amer 55 (*)    Anion gap 28 (*)    All other components within normal limits  ACETAMINOPHEN LEVEL - Abnormal; Notable for the following components:   Acetaminophen (Tylenol), Serum <10 (*)    All other components within normal limits  ETHANOL  SALICYLATE LEVEL  URINE DRUG SCREEN, QUALITATIVE ([facility] ONLY)  TROPONIN I (HIGH SENSITIVITY)   ____________________________________________  EKG  ED ECG REPORT I, Rudene Re, the attending physician, personally viewed and interpreted this ECG.  Sinus tachycardia, rate of 108, right bundle branch block, LVH, normal QTC, normal axis, no ST elevations or  depressions.  Sinus tachycardia is new when compared to prior however no other significant changes ____________________________________________  RADIOLOGY  I have personally reviewed the images performed during this visit and I agree with the Radiologist's read.   Interpretation by Radiologist:  Ct Head Wo Contrast  Result Date: 05/28/2019 CLINICAL DATA:  Encephalopathy, seizures EXAM: CT HEAD WITHOUT CONTRAST TECHNIQUE: Contiguous axial images were obtained from the base of the skull through the vertex without intravenous contrast. COMPARISON:  CT head 08/03/2018 FINDINGS: Brain: No evidence of acute infarction, hemorrhage, hydrocephalus, extra-axial collection or mass lesion/mass effect. Vascular: No hyperdense vessel or unexpected calcification. Skull: No calvarial fracture or  suspicious osseous lesion. No scalp swelling or hematoma. Sinuses/Orbits: Minimal mural disease in the ethmoids. Paranasal sinuses and mastoid air cells are otherwise predominantly clear. No air-fluid levels included orbital structures are unremarkable. Other: None IMPRESSION: No acute intracranial abnormality. Electronically Signed   By: Kreg ShropshirePrice  DeHay M.D.   On: 05/28/2019 03:18     ____________________________________________   PROCEDURES  Procedure(s) performed: None Procedures Critical Care performed: yes  CRITICAL CARE Performed by: Nita Sicklearolina Darryon Bastin  ?  Total critical care time: 35 min  Critical care time was exclusive of separately billable procedures and treating other patients.  Critical care was necessary to treat or prevent imminent or life-threatening deterioration.  Critical care was time spent personally by me on the following activities: development of treatment plan with patient and/or surrogate as well as nursing, discussions with consultants, evaluation of patient's response to treatment, examination of patient, obtaining history from patient or surrogate, ordering and performing  treatments and interventions, ordering and review of laboratory studies, ordering and review of radiographic studies, pulse oximetry and re-evaluation of patient's condition.  ____________________________________________   INITIAL IMPRESSION / ASSESSMENT AND PLAN / ED COURSE  23 y.o. male with history of drug abuse and seizure disorder who presents as an emergency traffic for possible opiate overdose followed by seizure.  Patient with known history of drug use and noncompliance with his seizure medications.  EMS found patient with respiratory depression which improved after Narcan however patient did have a seizure in route.  Patient arrives postictal but breathing normally and satting 100% on 4 L nasal cannula with normal end-tidal CO2. Normal CBG.  Pupils are dilated but reactive, no signs of trauma, patient combative and moving all 4 extremities, face is symmetric.  Patient was given 2 mg of IV Ativan here for combativeness. Will monitor closely for need to intubate. At this time, patient is protecting his airway. Labs pending. Will monitor until back to baseline.     _________________________ 12:40 AM on 05/28/2019 -----------------------------------------  Patient had a witnessed generalized tonic clonic seizure in the ED. Given 2mg  of ativan which stopped the seizure. WIll load with Keppra.   _________________________ 3:34 AM on 05/28/2019 -----------------------------------------  Patient with prolonged postictal state after several episodes of seizures concerning for status epilepticus.  Not seizing at this time since receiving IV Keppra.  Head CT was done due to prolonged postictal state and that is negative.  Will admit patient to the hospitalist service.   As part of my medical decision making, I reviewed the following data within the electronic MEDICAL RECORD NUMBER Nursing notes reviewed and incorporated, Labs reviewed , EKG interpreted , Old EKG reviewed, Old chart reviewed, Radiograph  reviewed , Discussed with admitting physician , Notes from prior ED visits and [place] Controlled Substance Database   Please note:  Patient was evaluated in Emergency Department today for the symptoms described in the history of present illness. Patient was evaluated in the context of the global COVID-19 pandemic, which necessitated consideration that the patient might be at risk for infection with the SARS-CoV-2 virus that causes COVID-19. Institutional protocols and algorithms that pertain to the evaluation of patients at risk for COVID-19 are in a state of rapid change based on information released by regulatory bodies including the CDC and federal and state organizations. These policies and algorithms were followed during the patient's care in the ED.  Some ED evaluations and interventions may be delayed as a result of limited staffing during the pandemic.   ____________________________________________  FINAL CLINICAL IMPRESSION(S) / ED DIAGNOSES   Final diagnoses:  Status epilepticus (HCC)  Drug overdose, undetermined intent, initial encounter      NEW MEDICATIONS STARTED DURING THIS VISIT:  ED Discharge Orders    None       Note:  This document was prepared using Dragon voice recognition software and may include unintentional dictation errors.    Nita Sickle, MD 05/28/19 [phone]

## 2019-05-28 NOTE — H&P (Addendum)
History and Physical  Patient Name: Timothy Cole     FGH:[id number]    DOB: Apr 07, 1996    DOA: 05/27/2019 PCP: Patient, No Pcp Per  Patient coming from: Home  Chief Complaint: Seizure      HPI: Timothy Cole is a 23 y.o. M with hx seizures, noncompliant with AEDs who presents with unresponsive episode.  Caveat that the patient is still extremely drowsy from Ativan, possibly postictal, so history collected from EDP via EMS.  EMS were called by family due to the patient being unresponsive.  On arrival he was apneic and satting 40% on room air, bagging given Narcan and seemed to have some improvement. En route, he had 2 tonic clonic seizures, aborted with Versed.    In the ER, he had another seizure.  CT head unremarkable.  Bicarb 11, anion gap 28, glucose 260.  No previous history of diabetes.  Creatinine 1.7 from baseline 1.3, WBC 17.2K.  Was given Ativan and loaded with Keppra in the hospital service were asked to evaluate for seizure.    ADDENDUM: Mother arrived and provided history -- patient had been away from home, at friends house for 3 days.  Arrived home 1 day PTA, seemed fine, not sick.    On day of admission, mother came home in afternoon, they argued and he took some pills he had (she thinks most likely illicit pills), then went in his room.  She heard him "banging around in there" and then shortly after went quiet, so she went to check on him and found him slumped on toilet, completely unresponsive and barely breathing, so she called EMS.        ROS: Caveat that the patient is a poor historian due to decreased sensorium.  However he reports 1 to 2 days of the following symptoms  Review of Systems  Constitutional: Positive for diaphoresis and malaise/fatigue. Negative for fever.  HENT: Positive for congestion.   Respiratory: Positive for shortness of breath. Negative for cough, sputum production and wheezing.   Cardiovascular: Negative for chest pain.   Gastrointestinal: Positive for nausea. Negative for abdominal pain, blood in stool and diarrhea.  Genitourinary: Negative for dysuria.  Endo/Heme/Allergies:       Polyuria  All other systems reviewed and are negative.         Past Medical History:  Diagnosis Date  . Abdominal pain, recurrent   . Allergy    Dust  . Anxiety   . Drug abuse ([place])   . Hematemesis   . Seizures ([place])     Past Surgical History:  Procedure Laterality Date  . ESOPHAGOGASTRODUODENOSCOPY  09/19/2011   Procedure: ESOPHAGOGASTRODUODENOSCOPY (EGD);  Surgeon: Oletha Blend, MD;  Location: [facility];  Service: Gastroenterology;  Laterality: N/A;  . KNEE SURGERY Left     Social History:  Smoker per chart, otherwise patient unable to provide this information independently.  Allergies  Allergen Reactions  . Shellfish Allergy Nausea And Vomiting    Family history: family history includes Birth defects in his maternal grandmother; Cholelithiasis in his maternal aunt; Diabetes in his maternal grandfather and maternal grandmother; Heart disease in his maternal grandfather; Hyperlipidemia in his maternal grandfather; Hypertension in his maternal grandfather and mother; Other in his father; Pancreatic cancer in his maternal grandfather; Ulcers in his maternal grandmother.  Prior to Admission medications   Medication Sig Start Date End Date Taking? Authorizing Provider  citalopram (CELEXA) 10 MG tablet Take 10 mg by mouth daily.    [provider]  famotidine (PEPCID) 40 MG tablet Take 1 tablet (40 mg total) by mouth every evening. 01/23/17 01/23/18  Schaevitz, Myra Rudeavid Matthew, MD  lamoTRIgine (LAMICTAL) 100 MG tablet Take 1 tablet (100 mg total) by mouth 2 (two) times daily. 12/09/18   Irean HongSung, Jade J, MD  lamoTRIgine (LAMICTAL) 100 MG tablet Take 1 tablet (100 mg total) by mouth 2 (two) times daily. 02/11/19 02/11/20  Phineas SemenGoodman, Graydon, MD       Physical Exam: BP 107/69 (BP Location: Right Arm)   Pulse 83   Temp  (!) 97.5 F (36.4 C) (Axillary)   Resp 16   Ht 6' (1.829 m)   Wt 70 kg   SpO2 99%   BMI 20.93 kg/m  General appearance: Well-developed, adult male, somnolent and snoring initially, arouses and is able to answer few questions but falls back asleep midsentence.  No obvious distress.    Eyes: Anicteric, conjunctiva pink, lids and lashes normal. PERRL.    ENT: No nasal deformity, discharge, epistaxis.  Hearing normal. OP moist without lesions.  No tongue lacerations.  Lips normal, dentition normal. Neck: No neck masses.  Trachea midline.  No thyromegaly/tenderness. Lymph: No cervical or supraclavicular lymphadenopathy. Skin: Warm and dry.  No jaundice.  No suspicious rashes or lesions. Cardiac: RRR, nl S1-S2, no murmurs appreciated.  Capillary refill is brisk.  JVP normal.  No LE edema.  Radial pulses 2+ and symmetric. Respiratory: Snoring, respirations easy and unlabored when awake.  Lungs clear without rales or wheezes.   Abdomen: Abdomen soft.  No TTP or guarding. No ascites, distension, hepatosplenomegaly.   MSK: No deformities or effusions of the large joints of the upper or lower extremities bilaterally.  No cyanosis or clubbing. Neuro: Speech is fluent.  Muscle strength diminished due to sedation, but appears symmetric.  Extraocular movements intact, pupils equal and reactive.    Psych: Sensorium intact and responding to questions, but attention diminished, affect blunted by sedation.  Judgment insight appear impaired by somnolence.     Labs on Admission:  I have personally reviewed following labs and imaging studies: CBC: Recent Labs  Lab 05/27/19 2343  WBC 17.2*  NEUTROABS 10.7*  HGB 12.4*  HCT 41.5  MCV 78.4*  PLT 238   Basic Metabolic Panel: Recent Labs  Lab 05/27/19 2343  NA 138  K 3.8  CL 99  CO2 11*  GLUCOSE 281*  BUN 15  CREATININE 1.71*  CALCIUM 8.7*   GFR: Estimated Creatinine Clearance: 66 mL/min (A) (by C-G formula based on SCr of 1.71 mg/dL (H)).   Liver Function Tests: Recent Labs  Lab 05/27/19 2343  AST 34  ALT 15  ALKPHOS 48  BILITOT 0.7  PROT 7.7  ALBUMIN 4.4   CBG: Recent Labs  Lab 05/27/19 2336  GLUCAP 260*      Radiological Exams on Admission: Personally reviewed CT head report unremarkable: Ct Head Wo Contrast  Result Date: 05/28/2019 CLINICAL DATA:  Encephalopathy, seizures EXAM: CT HEAD WITHOUT CONTRAST TECHNIQUE: Contiguous axial images were obtained from the base of the skull through the vertex without intravenous contrast. COMPARISON:  CT head 08/03/2018 FINDINGS: Brain: No evidence of acute infarction, hemorrhage, hydrocephalus, extra-axial collection or mass lesion/mass effect. Vascular: No hyperdense vessel or unexpected calcification. Skull: No calvarial fracture or suspicious osseous lesion. No scalp swelling or hematoma. Sinuses/Orbits: Minimal mural disease in the ethmoids. Paranasal sinuses and mastoid air cells are otherwise predominantly clear. No air-fluid levels included orbital structures are unremarkable. Other: None IMPRESSION: No acute  intracranial abnormality. Electronically Signed   By: Kreg Shropshire M.D.   On: 05/28/2019 03:18    EKG: Independently reviewed.  Sinus tachycardia, rate 470, LVH       Assessment/Plan   Seizure Loaded with Keppra in the ER.  Previously has had side effects from Keppra and been transitioned to Lamictal.  Suspect he is noncompliant, per chart.  CT head unremarkable.  -Consult neurology -Continue home lamotrigine  -Seizure precautions -As needed Ativan for seizure -Follow UDS   Acute metabolic encephalopathy Postictal from seizure, appears to be improving.  Elevated anion gap metabolic acidosis, resolved Suspect this was lactic acidosis from his prolonged seizure.  It resolved after fluids.  Hyperglycemia Glucose transiently elevated after seizure.  Now hypoglycemic. -D5 1/2 NS until able to take PO  Acute kidney injury Baseline creatinine  1.2-1.4 -Check urine electrolytes -IV fluids and trend creatinine  Leukocytosis Reactive due to seizure     DVT prophylaxis: Lovenox  Code Status: FULL  Family Communication:   Disposition Plan: Anticipate IV fluids, Keppra and lamictal and Neurology consultation Consults called: Neurology, message sent Admission status: INPATIENT     Medical decision making: Patient seen at 4:15 AM on 05/28/2019.  The patient was discussed with Dr. Don Perking.  What exists of the patient's chart was reviewed in depth and summarized above.  Clinical condition: no further seizure activity since Keppra load, mentation improving.        Earl Lites Trenae Brunke [facility] Please page though AMION or Epic secure chat:  For password, contact charge nurse

## 2019-08-09 ENCOUNTER — Other Ambulatory Visit: Payer: Self-pay

## 2019-08-09 ENCOUNTER — Emergency Department
Admission: EM | Admit: 2019-08-09 | Discharge: 2019-08-09 | Disposition: A | Discharge status: Home / Self Care | Payer: PRIVATE HEALTH INSURANCE | Attending: Emergency Medicine | Admitting: Emergency Medicine

## 2019-08-09 ENCOUNTER — Encounter: Payer: Self-pay | Admitting: Emergency Medicine

## 2019-08-09 ENCOUNTER — Emergency Department: Payer: PRIVATE HEALTH INSURANCE

## 2019-08-09 DIAGNOSIS — R112 Nausea with vomiting, unspecified: Secondary | ICD-10-CM | POA: Diagnosis not present

## 2019-08-09 DIAGNOSIS — Z87891 Personal history of nicotine dependence: Secondary | ICD-10-CM | POA: Diagnosis not present

## 2019-08-09 DIAGNOSIS — E119 Type 2 diabetes mellitus without complications: Secondary | ICD-10-CM | POA: Insufficient documentation

## 2019-08-09 DIAGNOSIS — Z79899 Other long term (current) drug therapy: Secondary | ICD-10-CM | POA: Diagnosis not present

## 2019-08-09 DIAGNOSIS — R569 Unspecified convulsions: Secondary | ICD-10-CM | POA: Insufficient documentation

## 2019-08-09 DIAGNOSIS — R1030 Lower abdominal pain, unspecified: Secondary | ICD-10-CM | POA: Diagnosis not present

## 2019-08-09 DIAGNOSIS — Z20822 Contact with and (suspected) exposure to covid-19: Secondary | ICD-10-CM | POA: Diagnosis not present

## 2019-08-09 LAB — URINE DRUG SCREEN, QUALITATIVE (ARMC ONLY)
Amphetamines, Ur Screen: NOT DETECTED
Barbiturates, Ur Screen: NOT DETECTED
Benzodiazepine, Ur Scrn: POSITIVE — AB
Cannabinoid 50 Ng, Ur ~~LOC~~: POSITIVE — AB
Cocaine Metabolite,Ur ~~LOC~~: NOT DETECTED
MDMA (Ecstasy)Ur Screen: NOT DETECTED
Methadone Scn, Ur: NOT DETECTED
Opiate, Ur Screen: NOT DETECTED
Phencyclidine (PCP) Ur S: NOT DETECTED
Tricyclic, Ur Screen: NOT DETECTED

## 2019-08-09 LAB — URINALYSIS, COMPLETE (UACMP) WITH MICROSCOPIC
Bacteria, UA: NONE SEEN
Bilirubin Urine: NEGATIVE
Glucose, UA: NEGATIVE mg/dL
Hgb urine dipstick: NEGATIVE
Ketones, ur: 20 mg/dL — AB
Nitrite: NEGATIVE
Protein, ur: 30 mg/dL — AB
Specific Gravity, Urine: 1.038 — ABNORMAL HIGH (ref 1.005–1.030)
pH: 5 (ref 5.0–8.0)

## 2019-08-09 LAB — COMPREHENSIVE METABOLIC PANEL
ALT: 16 U/L (ref 0–44)
AST: 20 U/L (ref 15–41)
Albumin: 4.6 g/dL (ref 3.5–5.0)
Alkaline Phosphatase: 42 U/L (ref 38–126)
Anion gap: 12 (ref 5–15)
BUN: 22 mg/dL — ABNORMAL HIGH (ref 6–20)
CO2: 26 mmol/L (ref 22–32)
Calcium: 9.7 mg/dL (ref 8.9–10.3)
Chloride: 101 mmol/L (ref 98–111)
Creatinine, Ser: 1 mg/dL (ref 0.61–1.24)
GFR calc Af Amer: >60 mL/min (ref >60)
GFR calc non Af Amer: >60 mL/min (ref >60)
Glucose, Bld: 98 mg/dL (ref 70–99)
Potassium: 3.7 mmol/L (ref 3.5–5.1)
Sodium: 139 mmol/L (ref 135–145)
Total Bilirubin: 0.9 mg/dL (ref 0.3–1.2)
Total Protein: 8.2 g/dL — ABNORMAL HIGH (ref 6.5–8.1)

## 2019-08-09 LAB — LIPASE, BLOOD: Lipase: 19 U/L (ref 11–51)

## 2019-08-09 LAB — CBC
HCT: 43.9 % (ref 39.0–52.0)
Hemoglobin: 13.6 g/dL (ref 13.0–17.0)
MCH: 23.3 pg — ABNORMAL LOW (ref 26.0–34.0)
MCHC: 31 g/dL (ref 30.0–36.0)
MCV: 75.2 fL — ABNORMAL LOW (ref 80.0–100.0)
Platelets: 225 10*3/uL (ref 150–400)
RBC: 5.84 MIL/uL — ABNORMAL HIGH (ref 4.22–5.81)
RDW: 14.5 % (ref 11.5–15.5)
WBC: 11.8 10*3/uL — ABNORMAL HIGH (ref 4.0–10.5)
nRBC: 0 % (ref 0.0–0.2)

## 2019-08-09 LAB — RESPIRATORY PANEL BY RT PCR (FLU A&B, COVID)
Influenza A by PCR: NEGATIVE
Influenza B by PCR: NEGATIVE
SARS Coronavirus 2 by RT PCR: NEGATIVE

## 2019-08-09 MED ORDER — ONDANSETRON HCL 4 MG/2ML IJ SOLN
4.0000 mg | Freq: Once | INTRAMUSCULAR | Status: AC
  Administered 2019-08-09: 4 mg via INTRAVENOUS
  Filled 2019-08-09: qty 2

## 2019-08-09 MED ORDER — LAMOTRIGINE 100 MG PO TABS
100.0000 mg | ORAL_TABLET | Freq: Once | ORAL | Status: AC
  Administered 2019-08-09: 14:00:00 100 mg via ORAL
  Filled 2019-08-09: qty 1

## 2019-08-09 MED ORDER — ONDANSETRON HCL 4 MG/2ML IJ SOLN
4.0000 mg | Freq: Once | INTRAMUSCULAR | Status: AC
  Administered 2019-08-09: 14:00:00 4 mg via INTRAVENOUS
  Filled 2019-08-09: qty 2

## 2019-08-09 MED ORDER — ACETAMINOPHEN 500 MG PO TABS
1000.0000 mg | ORAL_TABLET | Freq: Once | ORAL | Status: AC
  Administered 2019-08-09: 14:00:00 1000 mg via ORAL
  Filled 2019-08-09: qty 2

## 2019-08-09 MED ORDER — IOHEXOL 300 MG/ML  SOLN
100.0000 mL | Freq: Once | INTRAMUSCULAR | Status: AC | PRN
  Administered 2019-08-09: 14:00:00 100 mL via INTRAVENOUS
  Filled 2019-08-09: qty 100

## 2019-08-09 MED ORDER — SODIUM CHLORIDE 0.9 % IV BOLUS
1000.0000 mL | Freq: Once | INTRAVENOUS | Status: AC
  Administered 2019-08-09: 1000 mL via INTRAVENOUS

## 2019-08-09 MED ORDER — ONDANSETRON 4 MG PO TBDP: 4.0000 mg | ORAL_TABLET | Freq: Three times a day (TID) | ORAL | 0 refills | Status: DC | PRN

## 2019-08-09 NOTE — ED Triage Notes (Signed)
FIRST NURSE NOTE- pt reports 2-3 seizures today.  Has been taking his keppra.  Seizures normally well controlled.  Has had vomiting last couple days and been sick.  Ambulatory. Does not know when seizure coming.

## 2019-08-09 NOTE — Discharge Instructions (Addendum)
I prescribed some Zofran to help with your nausea and vomiting.  Take this 30 minutes before taking her Lamictal so we can make sure that it stays down.  Make sure you are taking her Lamictal.  You should stop using marijuana because this can make nausea and vomiting worse.  Return to the ER for any other concerns.  You should call your neurologist and let them know of your concerns for increase in seizures to see if they need to adjust your medications.

## 2019-08-09 NOTE — ED Triage Notes (Signed)
Patient reports repeated seizure x2-3 days. States he has had them in the past but not like this. States he has been compliant with meds. Also reports NV and states he is unable to keep anything down.

## 2019-08-09 NOTE — ED Provider Notes (Signed)
[facility] Emergency Department Provider Note  ____________________________________________   First MD Initiated Contact with Patient 08/09/19 1309     (approximate)  I have reviewed the triage vital signs and the nursing notes.   HISTORY  Chief Complaint Seizures and Abdominal Pain    HPI Timothy Cole is a 24 y.o. male with drug abuse, seizures who comes in with seizures and abdominal pain.  Patient states that he has had severe abdominal pain in the lower abdomen for 3 days, constant, nothing makes better, nothing makes it worse.  He also reports nausea and vomiting associated with it.  Patient states that he has been compliant with his medications but when I asked him what medication he is on he says Keppra.  I stated that in the chart it looks like he is on Lamictal.  He stated that he needs to call his mom to figure out what medicine he was on.  He was also unsure what dose it was.  After calling his mom he did find out that it was actually Lamictal that he was on.  He denies any drug use recently.  He states that he has had more frequent seizures and has just felt generalized weakness and not feeling well.  Patient denies hitting his head during his seizures.  He denies a headache.  Denies any chest pain or shortness of breath.          Past Medical History:  Diagnosis Date  . Abdominal pain, recurrent   . Allergy    Dust  . Anxiety   . Drug abuse ([place])   . Hematemesis   . Seizures [facility])     Patient Active Problem List   Diagnosis Date Noted  . DKA (diabetic ketoacidoses) ([place]) 05/28/2019  . AKI (acute kidney injury) ([place]) 05/28/2019  . Microcytic anemia 05/28/2019  . Seizures ([place]) 05/28/2019  . Nonintractable epilepsy without status epilepticus ([place]) 01/19/2018  . Epigastric abdominal pain   . Hematemesis     Past Surgical History:  Procedure Laterality Date  . ESOPHAGOGASTRODUODENOSCOPY  09/19/2011   Procedure:  ESOPHAGOGASTRODUODENOSCOPY (EGD);  Surgeon: Oletha Blend, MD;  Location: [facility];  Service: Gastroenterology;  Laterality: N/A;  . KNEE SURGERY Left     Prior to Admission medications   Medication Sig Start Date End Date Taking? Authorizing Provider  citalopram (CELEXA) 20 MG tablet Take 20 mg by mouth daily. 04/28/19   [provider]  famotidine (PEPCID) 40 MG tablet Take 1 tablet (40 mg total) by mouth every evening. 01/23/17 01/23/18  Schaevitz, Randall An, MD  hydrOXYzine (VISTARIL) 25 MG capsule Take 25 mg by mouth 2 (two) times daily as needed. 04/28/19   [provider]  lamoTRIgine (LAMICTAL) 100 MG tablet Take 1 tablet (100 mg total) by mouth 2 (two) times daily. 12/09/18   Paulette Blanch, MD  risperiDONE (RISPERDAL) 2 MG tablet Take 2 mg by mouth at bedtime. 05/21/19   [provider]    Allergies Shellfish allergy  Family History  Problem Relation Age of Onset  . Cholelithiasis Maternal Aunt   . Ulcers Maternal Grandmother   . Birth defects Maternal Grandmother   . Diabetes Maternal Grandmother   . Diabetes Maternal Grandfather   . Heart disease Maternal Grandfather   . Hyperlipidemia Maternal Grandfather   . Hypertension Maternal Grandfather   . Pancreatic cancer Maternal Grandfather   . Hypertension Mother   . Other Father  unsure of history    Social History Social History   Tobacco Use  . Smoking status: Former Smoker    Packs/day: 1.00    Types: Cigarettes  . Smokeless tobacco: Never Used  Substance Use Topics  . Alcohol use: No  . Drug use: No      Review of Systems Constitutional: No fever/chills Eyes: No visual changes. ENT: No sore throat. Cardiovascular: Denies chest pain. Respiratory: Denies shortness of breath. Gastrointestinal: Positive abdominal pain, nausea, vomiting no diarrhea.  No constipation. Genitourinary: Negative for dysuria. Musculoskeletal: Negative for back pain. Skin: Negative for  rash. Neurological: Negative for headaches, focal weakness or numbness.  Positive seizures All other ROS negative ____________________________________________   PHYSICAL EXAM:  VITAL SIGNS: ED Triage Vitals  Enc Vitals Group     BP 08/09/19 1218 119/69     Pulse Rate 08/09/19 1218 78     Resp 08/09/19 1218 16     Temp 08/09/19 1218 98.7 F (37.1 C)     Temp Source 08/09/19 1218 Oral     SpO2 08/09/19 1218 98 %     Weight 08/09/19 1219 150 lb (68 kg)     Height 08/09/19 1219 5\' 8"  (1.727 m)     Head Circumference --      Peak Flow --      Pain Score 08/09/19 1219 8     Pain Loc --      Pain Edu? --      Excl. in GC? --     Constitutional: Alert and oriented. Well appearing and in no acute distress. Eyes: Conjunctivae are normal. EOMI. Head: Atraumatic. Nose: No congestion/rhinnorhea. Mouth/Throat: Mucous membranes are moist.   Neck: No stridor. Trachea Midline. FROM Cardiovascular: Normal rate, regular rhythm. Grossly normal heart sounds.  Good peripheral circulation. Respiratory: Normal respiratory effort.  No retractions. Lungs CTAB. Gastrointestinal: Tender in the lower abdomen.  No distention. No abdominal bruits.  Musculoskeletal: No lower extremity tenderness nor edema.  No joint effusions. Neurologic:  Normal speech and language. No gross focal neurologic deficits are appreciated.  Equal strength in the arms and legs. Skin:  Skin is warm, dry and intact. No rash noted. Psychiatric: Mood and affect are normal. Speech and behavior are normal. GU: Deferred   ____________________________________________   LABS (all labs ordered are listed, but only abnormal results are displayed)  Labs Reviewed  COMPREHENSIVE METABOLIC PANEL - Abnormal; Notable for the following components:      Result Value   BUN 22 (*)    Total Protein 8.2 (*)    All other components within normal limits  CBC - Abnormal; Notable for the following components:   WBC 11.8 (*)    RBC 5.84 (*)     MCV 75.2 (*)    MCH 23.3 (*)    All other components within normal limits  URINALYSIS, COMPLETE (UACMP) WITH MICROSCOPIC - Abnormal; Notable for the following components:   Color, Urine YELLOW (*)    APPearance HAZY (*)    Specific Gravity, Urine 1.038 (*)    Ketones, ur 20 (*)    Protein, ur 30 (*)    Leukocytes,Ua TRACE (*)    All other components within normal limits  URINE DRUG SCREEN, QUALITATIVE ([facility] ONLY) - Abnormal; Notable for the following components:   Cannabinoid 50 Ng, Ur [place] POSITIVE (*)    Benzodiazepine, Ur Scrn POSITIVE (*)    All other components within normal limits  RESPIRATORY PANEL BY RT PCR (FLU A&B, COVID)  LIPASE, BLOOD   ____________________________________________   ED ECG REPORT I, Concha Se, the attending physician, personally viewed and interpreted this ECG.  EKG shows sinus bradycardia rate of 52 with some ST elevation in V2 and V3 and V4 most likely early repolarization without any T wave abnormalities, normal intervals ____________________________________________  RADIOLOGY   Official radiology report(s): CT Head Wo Contrast  Result Date: 08/09/2019 CLINICAL DATA:  Breakthrough seizures today. EXAM: CT HEAD WITHOUT CONTRAST TECHNIQUE: Contiguous axial images were obtained from the base of the skull through the vertex without intravenous contrast. COMPARISON:  05/28/2019 FINDINGS: Brain: The ventricles are normal in size and configuration. No extra-axial fluid collections are identified. The gray-white differentiation is maintained. No CT findings for acute hemispheric infarction or intracranial hemorrhage. No mass lesions. The brainstem and cerebellum are normal. Vascular: No hyperdense vessels or obvious aneurysm. Skull: No acute skull fracture. No bone lesion. Sinuses/Orbits: The paranasal sinuses and mastoid air cells are clear. The globes are intact. Other: No scalp lesions, laceration or hematoma. IMPRESSION: Normal head CT. Electronically  Signed   By: Rudie Meyer M.D.   On: 08/09/2019 15:50   CT ABDOMEN PELVIS W CONTRAST  Result Date: 08/09/2019 CLINICAL DATA:  Abdominal distension EXAM: CT ABDOMEN AND PELVIS WITH CONTRAST TECHNIQUE: Multidetector CT imaging of the abdomen and pelvis was performed using the standard protocol following bolus administration of intravenous contrast. CONTRAST:  OMNIPAQUE IOHEXOL 300 MG/ML  SOLN COMPARISON:  08/23/2012 FINDINGS: Lower chest: No acute abnormality. Hepatobiliary: No focal liver abnormality is seen. No gallstones, gallbladder wall thickening, or biliary dilatation. Pancreas: Unremarkable. No pancreatic ductal dilatation or surrounding inflammatory changes. Spleen: Normal in size without focal abnormality. Adrenals/Urinary Tract: Adrenal glands are within normal limits. Kidneys demonstrate a normal enhancement pattern bilaterally. No obstructive changes are seen. The bladder is decompressed. Stomach/Bowel: The appendix is well visualized and within normal limits. No obstructive or inflammatory changes of the large or small bowel are seen. The stomach is within normal limits. Vascular/Lymphatic: No significant vascular findings are present. No enlarged abdominal or pelvic lymph nodes. Reproductive: Prostate is unremarkable. Other: No abdominal wall hernia or abnormality. No abdominopelvic ascites. Musculoskeletal: No acute or significant osseous findings. IMPRESSION: No acute abnormality noted. Electronically Signed   By: Alcide Clever M.D.   On: 08/09/2019 14:35    ____________________________________________   PROCEDURES  Procedure(s) performed (including Critical Care):  Procedures   ____________________________________________   INITIAL IMPRESSION / ASSESSMENT AND PLAN / ED COURSE  Timothy Cole was evaluated in Emergency Department on 08/09/2019 for the symptoms described in the history of present illness. He was evaluated in the context of the global COVID-19 pandemic, which  necessitated consideration that the patient might be at risk for infection with the SARS-CoV-2 virus that causes COVID-19. Institutional protocols and algorithms that pertain to the evaluation of patients at risk for COVID-19 are in a state of rapid change based on information released by regulatory bodies including the CDC and federal and state organizations. These policies and algorithms were followed during the patient's care in the ED.    Patient with known seizure disorder and history of drug abuse as well as medication noncompliance comes in for abdominal pain, nausea vomiting and seizures.  I suspect that he is having his seizures due to his vomiting and not been able to keep down his Lamictal.  Patient is tender in the lower abdomen on both sides will get CT scan to make sure there is no sign appendicitis, perforation.  Will also get coronavirus testing.  No shortness of breath or chest pain to suggest esophageal rupture.  He did not hit his head and denies any headache and has a normal neuro exam and has a history of seizures with multiple CTs of his head in the past.  I do not think that we need to reCT his head today.  Will get labs to evaluate for electrolyte abnormalities, AKI  White count is down from his prior visits.  UA does not look a little concentrated like he may be dehydrated.  Patient denied using drugs besides THC although he is positive for benzos.  I reviewed the drug database and he is not getting any medications for benzos.  CT abdomen is negative.  I reevaluated patient he states that he is having a little bit of a headache now and feels a little foggy.  He states he thinks he did hit his head.  I did call patient's mother with the patient's permission.  She stated that she did not see him to have any the seizures today but yesterday he just looked foggy and like he had already had a seizure.  She does not think he is taking his medications.  She knows that he has been using  marijuana but unsure if he has been using anything else.  Patient has been in the ER for 5 hours and not had any seizure activity.  Patient has been ambulatory around the room and tolerated drinking.  I discussed having home with some Zofran to make sure he can keep down his Lamictal.  He declined need another prescription.  Patient felt comfortable with being discharged home at this time.  I discussed the provisional nature of ED diagnosis, the treatment so far, the ongoing plan of care, follow up appointments and return precautions with the patient and any family or support people present. They expressed understanding and agreed with the plan, discharged home.  ____________________________________________   FINAL CLINICAL IMPRESSION(S) / ED DIAGNOSES   Final diagnoses:  Seizure (HCC)  Intractable vomiting with nausea, unspecified vomiting type      MEDICATIONS GIVEN DURING THIS VISIT:  Medications  sodium chloride 0.9 % bolus 1,000 mL (1,000 mLs Intravenous New Bag/Given 08/09/19 1404)  ondansetron (ZOFRAN) injection 4 mg (4 mg Intravenous Given 08/09/19 1422)  acetaminophen (TYLENOL) tablet 1,000 mg (1,000 mg Oral Given 08/09/19 1422)  lamoTRIgine (LAMICTAL) tablet 100 mg (100 mg Oral Given 08/09/19 1422)  iohexol (OMNIPAQUE) 300 MG/ML solution 100 mL (100 mLs Intravenous Contrast Given 08/09/19 1408)  ondansetron (ZOFRAN) injection 4 mg (4 mg Intravenous Given 08/09/19 1541)     ED Discharge Orders         Ordered    ondansetron (ZOFRAN ODT) 4 MG disintegrating tablet  Every 8 hours PRN     08/09/19 1724           Note:  This document was prepared using Dragon voice recognition software and may include unintentional dictation errors.   Concha Se, MD 08/09/19 1725

## 2019-08-22 ENCOUNTER — Other Ambulatory Visit: Payer: Self-pay

## 2019-08-22 ENCOUNTER — Emergency Department
Admission: EM | Admit: 2019-08-22 | Discharge: 2019-08-23 | Disposition: A | Discharge status: Home / Self Care | Payer: PRIVATE HEALTH INSURANCE | Attending: Student | Admitting: Student

## 2019-08-22 ENCOUNTER — Encounter: Payer: Self-pay | Admitting: Emergency Medicine

## 2019-08-22 DIAGNOSIS — T40601A Poisoning by unspecified narcotics, accidental (unintentional), initial encounter: Secondary | ICD-10-CM

## 2019-08-22 DIAGNOSIS — T402X1A Poisoning by other opioids, accidental (unintentional), initial encounter: Secondary | ICD-10-CM | POA: Insufficient documentation

## 2019-08-22 DIAGNOSIS — R4020 Unspecified coma: Secondary | ICD-10-CM | POA: Diagnosis present

## 2019-08-22 DIAGNOSIS — T50901A Poisoning by unspecified drugs, medicaments and biological substances, accidental (unintentional), initial encounter: Secondary | ICD-10-CM

## 2019-08-22 LAB — GLUCOSE, CAPILLARY: Glucose-Capillary: 145 mg/dL — ABNORMAL HIGH (ref 70–99)

## 2019-08-22 MED ORDER — ONDANSETRON HCL 4 MG/2ML IJ SOLN
INTRAMUSCULAR | Status: AC
  Administered 2019-08-22: 4 mg via INTRAVENOUS
  Filled 2019-08-22: qty 2

## 2019-08-22 MED ORDER — NALOXONE HCL 2 MG/2ML IJ SOSY
2.0000 mg | PREFILLED_SYRINGE | Freq: Once | INTRAMUSCULAR | Status: AC
  Administered 2019-08-22: 2 mg via INTRAVENOUS

## 2019-08-22 MED ORDER — SODIUM CHLORIDE 0.9 % IV BOLUS
1000.0000 mL | Freq: Once | INTRAVENOUS | Status: AC
  Administered 2019-08-22: 1000 mL via INTRAVENOUS

## 2019-08-22 MED ORDER — ONDANSETRON HCL 4 MG/2ML IJ SOLN: 4.0000 mg | Freq: Once | INTRAMUSCULAR | Status: AC

## 2019-08-22 NOTE — ED Notes (Signed)
Pt maintaining airway at this time, breathingon own, SpO2 99%. Awake, vomiting

## 2019-08-22 NOTE — ED Provider Notes (Signed)
[facility] Emergency Department Provider Note  ____________________________________________   First MD Initiated Contact with Patient 08/22/19 1810     (approximate)  I have reviewed the triage vital signs and the nursing notes.  History  Chief Complaint Drug Overdose    HPI [place] C[facility] Doe is a 24 y.o. male who presented to the emergency department, dropped off by private vehicle, unresponsive.  Reported history of opiate use.  Initially unresponsive, oxygen low 80s, minimal respirations.  Breathing assisted by bag mask device.  2 mg IV Narcan administered with immediate awakening and vomiting.  Patient denies any heroin use today, states someone "gave him a Percocet" but says he only took 1.  Denies any other coingestions. Denies any ingestion w/ intent of self harm.    Past Medical Hx Opiate use  Problem List There are no problems to display for this patient.   Past Surgical Hx Non-contributory  Medications Prior to Admission medications   Not on File    Allergies Patient has no known allergies.  Family Hx No family history on file.  Social Hx Social History   Tobacco Use  . Smoking status: Not on file  Substance Use Topics  . Alcohol use: Not on file  . Drug use: Not on file     Review of Systems Unable to obtain due to unresponsiveness.   Physical Exam  Vital Signs: ED Triage Vitals  Enc Vitals Group     BP 08/22/19 1812 (!) 155/108     Pulse Rate 08/22/19 1812 (!) 103     Resp 08/22/19 1812 20     Temp --      Temp src --      SpO2 08/22/19 1812 100 %     Weight 08/22/19 1813 150 lb (68 kg)     Height 08/22/19 1813 5\' 9"  (1.753 m)     Head Circumference --      Peak Flow --      Pain Score 08/22/19 1813 9     Pain Loc --      Pain Edu? --      Excl. in [place]? --     Constitutional: Unresponsive.  Head: Normocephalic. Atraumatic. Eyes: Pinpoint pupils Nose: No congestion. No rhinorrhea. Mouth/Throat:  Wearing mask.  Neck: No stridor.   Cardiovascular: Tachycardic. Extremities well perfused. Respiratory: Low 80s initially on RA. RR ~6. BVM on arrival. Immediate improvement in respiratory status after administration of Narcan.  Gastrointestinal: Soft. Non-tender. Non-distended.  Musculoskeletal: No deformities. Neurologic:  Unresponsive.  Skin: Skin is warm, dry and intact. No rash noted. Psychiatric: Unable to assess 2/2 AMS.  EKG  Personally reviewed.   Rate: 114 Rhythm: sinus Axis: normal Intervals: WNL Sinus tachycardia No STEMI   Procedures  Procedure(s) performed (including critical care):  Procedures   Initial Impression / Assessment and Plan / ED Course  24 y.o. male who presents to the ED unresponsive, significant respiratory depression 2/2 opiate overdose. Immediate response with Narcan.   Will plan to observe to ensure no recurrence of unresponsiveness or apnea. Zofran and IVF.    Patient has been observed 5 hours past Narcan administration w/o recurrence of apnea or respiratory compromise. Still remains somewhat drowsy, but easily arousable to voice and light touch. Will plan to monitor until sobriety, re-eval, and anticipate discharge.    Final Clinical Impression(s) / ED Diagnosis  Final diagnoses:  Accidental drug overdose, initial encounter       Note:  This document was  prepared using Conservation officer, historic buildings and may include unintentional dictation errors.   Miguel Aschoff., MD 08/22/19 [phone]

## 2019-08-22 NOTE — ED Notes (Signed)
Pt more alert at this time. Pt states unsure what he took. No other needs noted at this time.

## 2019-08-22 NOTE — ED Notes (Signed)
Pt to ed pov with friend unresponsive, 83% on RA. Reported heroin use.  16 g L AC. 18G R FA.  2mg  IV narcan given at 1802, verrbal order Dr. . 1803 pt became responsive and able to maintain airway

## 2019-08-22 NOTE — ED Notes (Signed)
Dr monks at bedside. 

## 2019-08-22 NOTE — ED Notes (Signed)
4mg  zofran given verbal order dr 

## 2019-08-22 NOTE — ED Notes (Signed)
Pt requesting to use phone at this time to call mother.

## 2019-08-22 NOTE — ED Triage Notes (Signed)
Patient presented to the ED by private vehicle, unconscious.  Patient removed from vehicle by ED staff, placed in a wheelchair and taken to room 5.  Patient's respirations were 6 and oxygen saturation was in the 80s.  Patient's breathing was assisted by bag mask device until narcan could be administered.

## 2019-08-22 NOTE — ED Notes (Signed)
Pt mom called and this RN received permission to talk with his mother about his condition and status. Pt mom states that she will be the one to pick him up once he is ready for discharge. Pt mother states that she understands pt plan of care and has no further questions at this time.

## 2019-08-22 NOTE — ED Notes (Signed)
Pt presents to ED via POV, pt dropped off by friend in lobby per triage RN. Upon arrival to room 5 via wheelchair, pt noted to be bradypneic progressing to apnea, respirations assisted via BVM by Tobi Bastos, RN. 2 large bore IV's initiated by this RN and Tammy Sours, Charity fundraiser. 2mg  IV Narcan given by this RN as per VORB Dr. at bedside upon patient arrival to room. Pt immediately noted to awaken, initially with sonorous respirations, sat up in bed by medical staff. Pt Then began profusely vomiting, 4mg  Zofran administered per VORB from Dr. Marisa Severin due to patient vomiting.   At time of this note, pt noted to awaken to verbal stimuli however is noted to be sleepy, is able to tell name/birthday, place, is mildly confused regarding situation at this time.   Pt states name is , 1996-02-24.

## 2019-08-23 ENCOUNTER — Encounter: Payer: Self-pay | Admitting: Emergency Medicine

## 2019-08-23 NOTE — ED Notes (Signed)
Pt arranging ride home now.

## 2019-08-23 NOTE — ED Provider Notes (Signed)
I assumed care of the patient from Dr. Colon Branch at 11:00 PM.  Patient now awake and oriented x4 and hemodynamically stable..  Patient states that he took what he thought was a "Percocet 30mg " with the objective to be intoxicated before losing consciousness.  Patient denies any suicidal ideation.  Patient will be discharged home in the custody of his parents.   , MD 08/23/19 [phone]

## 2020-04-16 ENCOUNTER — Encounter: Payer: Self-pay | Admitting: *Deleted

## 2020-04-16 ENCOUNTER — Other Ambulatory Visit: Payer: Self-pay

## 2020-04-16 DIAGNOSIS — R1084 Generalized abdominal pain: Secondary | ICD-10-CM | POA: Insufficient documentation

## 2020-04-16 DIAGNOSIS — Z87891 Personal history of nicotine dependence: Secondary | ICD-10-CM | POA: Insufficient documentation

## 2020-04-16 DIAGNOSIS — E860 Dehydration: Secondary | ICD-10-CM | POA: Insufficient documentation

## 2020-04-16 DIAGNOSIS — R197 Diarrhea, unspecified: Secondary | ICD-10-CM | POA: Insufficient documentation

## 2020-04-16 DIAGNOSIS — R112 Nausea with vomiting, unspecified: Secondary | ICD-10-CM | POA: Insufficient documentation

## 2020-04-16 LAB — COMPREHENSIVE METABOLIC PANEL
ALT: 16 U/L (ref 0–44)
AST: 25 U/L (ref 15–41)
Albumin: 4.9 g/dL (ref 3.5–5.0)
Alkaline Phosphatase: 51 U/L (ref 38–126)
Anion gap: 12 (ref 5–15)
BUN: 18 mg/dL (ref 6–20)
CO2: 24 mmol/L (ref 22–32)
Calcium: 9.8 mg/dL (ref 8.9–10.3)
Chloride: 102 mmol/L (ref 98–111)
Creatinine, Ser: 1.07 mg/dL (ref 0.61–1.24)
GFR, Estimated: >60 mL/min (ref >60)
Glucose, Bld: 114 mg/dL — ABNORMAL HIGH (ref 70–99)
Potassium: 4.2 mmol/L (ref 3.5–5.1)
Sodium: 138 mmol/L (ref 135–145)
Total Bilirubin: 0.8 mg/dL (ref 0.3–1.2)
Total Protein: 8 g/dL (ref 6.5–8.1)

## 2020-04-16 LAB — URINALYSIS, COMPLETE (UACMP) WITH MICROSCOPIC
Bacteria, UA: NONE SEEN
Bilirubin Urine: NEGATIVE
Glucose, UA: NEGATIVE mg/dL
Hgb urine dipstick: NEGATIVE
Ketones, ur: 20 mg/dL — AB
Leukocytes,Ua: NEGATIVE
Nitrite: NEGATIVE
Protein, ur: 30 mg/dL — AB
Specific Gravity, Urine: 1.032 — ABNORMAL HIGH (ref 1.005–1.030)
pH: 5 (ref 5.0–8.0)

## 2020-04-16 LAB — CBC
HCT: 43.1 % (ref 39.0–52.0)
Hemoglobin: 13.7 g/dL (ref 13.0–17.0)
MCH: 23.5 pg — ABNORMAL LOW (ref 26.0–34.0)
MCHC: 31.8 g/dL (ref 30.0–36.0)
MCV: 73.8 fL — ABNORMAL LOW (ref 80.0–100.0)
Platelets: 232 10*3/uL (ref 150–400)
RBC: 5.84 MIL/uL — ABNORMAL HIGH (ref 4.22–5.81)
RDW: 14.5 % (ref 11.5–15.5)
WBC: 23.8 10*3/uL — ABNORMAL HIGH (ref 4.0–10.5)
nRBC: 0 % (ref 0.0–0.2)

## 2020-04-16 LAB — LIPASE, BLOOD: Lipase: 24 U/L (ref 11–51)

## 2020-04-16 LAB — TROPONIN I (HIGH SENSITIVITY)
Troponin I (High Sensitivity): 4 ng/L (ref <18)
Troponin I (High Sensitivity): 4 ng/L (ref <18)

## 2020-04-16 MED ORDER — ONDANSETRON 4 MG PO TBDP
4.0000 mg | ORAL_TABLET | Freq: Once | ORAL | Status: AC | PRN
  Administered 2020-04-16: 4 mg via ORAL
  Filled 2020-04-16: qty 1

## 2020-04-16 NOTE — ED Triage Notes (Signed)
ED via EMS reporting generalized abd pain and chest pain that started at 10:00 this morning with NVD. Pt has been unable to eat of drink throughout the day and denies having taken medication at home. Pt is afebrile upon arrival. Pt denies having had anything new to eat or drink yesterday and denies having been exposed to anyone with similar.

## 2020-04-16 NOTE — ED Triage Notes (Signed)
First Nurse Note:  Arrives [facility] for c/O abdominal pain.  Awoke at 1000 this morning with c/o N/V/D/  VS wnl.

## 2020-04-17 ENCOUNTER — Emergency Department
Admission: EM | Admit: 2020-04-17 | Discharge: 2020-04-17 | Disposition: A | Discharge status: Home / Self Care | Payer: PRIVATE HEALTH INSURANCE | Attending: Emergency Medicine | Admitting: Emergency Medicine

## 2020-04-17 ENCOUNTER — Emergency Department: Payer: PRIVATE HEALTH INSURANCE

## 2020-04-17 DIAGNOSIS — R112 Nausea with vomiting, unspecified: Secondary | ICD-10-CM

## 2020-04-17 DIAGNOSIS — E860 Dehydration: Secondary | ICD-10-CM

## 2020-04-17 DIAGNOSIS — R1084 Generalized abdominal pain: Secondary | ICD-10-CM

## 2020-04-17 LAB — CBC WITH DIFFERENTIAL/PLATELET
Abs Immature Granulocytes: 0.11 10*3/uL — ABNORMAL HIGH (ref 0.00–0.07)
Basophils Absolute: 0.1 10*3/uL (ref 0.0–0.1)
Basophils Relative: 0 %
Eosinophils Absolute: 0 10*3/uL (ref 0.0–0.5)
Eosinophils Relative: 0 %
HCT: 43.6 % (ref 39.0–52.0)
Hemoglobin: 13.4 g/dL (ref 13.0–17.0)
Immature Granulocytes: 1 %
Lymphocytes Relative: 5 %
Lymphs Abs: 1.1 10*3/uL (ref 0.7–4.0)
MCH: 22.9 pg — ABNORMAL LOW (ref 26.0–34.0)
MCHC: 30.7 g/dL (ref 30.0–36.0)
MCV: 74.7 fL — ABNORMAL LOW (ref 80.0–100.0)
Monocytes Absolute: 1.3 10*3/uL — ABNORMAL HIGH (ref 0.1–1.0)
Monocytes Relative: 6 %
Neutro Abs: 21 10*3/uL — ABNORMAL HIGH (ref 1.7–7.7)
Neutrophils Relative %: 88 %
Platelets: 249 10*3/uL (ref 150–400)
RBC: 5.84 MIL/uL — ABNORMAL HIGH (ref 4.22–5.81)
RDW: 14.4 % (ref 11.5–15.5)
WBC: 23.5 10*3/uL — ABNORMAL HIGH (ref 4.0–10.5)
nRBC: 0 % (ref 0.0–0.2)

## 2020-04-17 MED ORDER — DICYCLOMINE HCL 20 MG PO TABS: 20.0000 mg | ORAL_TABLET | Freq: Four times a day (QID) | ORAL | 0 refills | Status: AC

## 2020-04-17 MED ORDER — FAMOTIDINE IN NACL 20-0.9 MG/50ML-% IV SOLN
20.0000 mg | Freq: Once | INTRAVENOUS | Status: AC
  Administered 2020-04-17: 20 mg via INTRAVENOUS
  Filled 2020-04-17: qty 50

## 2020-04-17 MED ORDER — MORPHINE SULFATE (PF) 4 MG/ML IV SOLN
4.0000 mg | Freq: Once | INTRAVENOUS | Status: AC
  Administered 2020-04-17: 4 mg via INTRAVENOUS
  Filled 2020-04-17: qty 1

## 2020-04-17 MED ORDER — DEXTROSE-NACL 5-0.45 % IV SOLN: INTRAVENOUS | Status: DC

## 2020-04-17 MED ORDER — ONDANSETRON HCL 4 MG/2ML IJ SOLN
4.0000 mg | Freq: Once | INTRAMUSCULAR | Status: AC
  Administered 2020-04-17: 4 mg via INTRAVENOUS
  Filled 2020-04-17: qty 2

## 2020-04-17 MED ORDER — ONDANSETRON 4 MG PO TBDP: 4.0000 mg | ORAL_TABLET | Freq: Three times a day (TID) | ORAL | 0 refills | Status: AC | PRN

## 2020-04-17 MED ORDER — IOHEXOL 300 MG/ML  SOLN
100.0000 mL | Freq: Once | INTRAMUSCULAR | Status: AC | PRN
  Administered 2020-04-17: 100 mL via INTRAVENOUS

## 2020-04-17 NOTE — ED Notes (Signed)
Verbal consent for DC given by pt to this RN.

## 2020-04-17 NOTE — Discharge Instructions (Addendum)
1.  You may take medicines as needed for abdominal discomfort and nausea (Bentyl/Zofran #20). 2.  Clear liquids x12 hours, then bland diet x2 days, then [employer] x3 days, then slowly advance diet as tolerated. 3.  Return to the ER for worsening symptoms, persistent vomiting, difficulty breathing or other concerns.

## 2020-04-17 NOTE — ED Provider Notes (Signed)
[facility] Emergency Department Provider Note   ____________________________________________   First MD Initiated Contact with Patient 04/17/20 [phone]     (approximate)  I have reviewed the triage vital signs and the nursing notes.   HISTORY  Chief Complaint Abdominal Pain and Emesis    HPI Timothy Cole is a 24 y.o. male who presents to the ED via EMS from home with a chief complaint of generalized abdominal pain, nausea/vomiting/diarrhea.  Onset of symptoms approximately 10 AM.  Patient states simultaneous episodes of vomiting and diarrhea, unable to tolerate PO.  Denies fever, cough, chest pain, shortness of breath, dysuria.  Has been vaccinated against COVID-19.     Past Medical History:  Diagnosis Date  . Abdominal pain, recurrent   . Allergy    Dust  . Anxiety   . Drug abuse (HCC)   . Hematemesis   . Seizures [facility](HCC)     Patient Active Problem List   Diagnosis Date Noted  . DKA (diabetic ketoacidoses) 05/28/2019  . AKI (acute kidney injury) (HCC) 05/28/2019  . Microcytic anemia 05/28/2019  . Seizures (HCC) 05/28/2019  . Nonintractable epilepsy without status epilepticus (HCC) 01/19/2018  . Epigastric abdominal pain   . Hematemesis     Past Surgical History:  Procedure Laterality Date  . ESOPHAGOGASTRODUODENOSCOPY  09/19/2011   Procedure: ESOPHAGOGASTRODUODENOSCOPY (EGD);  Surgeon: Jon GillsJoseph H Clark, MD;  [facility];  Service: Gastroenterology;  Laterality: N/A;  . KNEE SURGERY Left     Prior to Admission medications   Medication Sig Start Date End Date Taking? Authorizing Provider  citalopram (CELEXA) 20 MG tablet Take 20 mg by mouth daily. 04/28/19   [provider]  dicyclomine (BENTYL) 20 MG tablet Take 1 tablet (20 mg total) by mouth every 6 (six) hours. 04/17/20   Irean HongSung, Enez Monahan J, MD  famotidine (PEPCID) 40 MG tablet Take 1 tablet (40 mg total) by mouth every evening. 01/23/17 01/23/18  Schaevitz, Myra Rudeavid Matthew, MD   hydrOXYzine (VISTARIL) 25 MG capsule Take 25 mg by mouth 2 (two) times daily as needed. 04/28/19   [provider]  lamoTRIgine (LAMICTAL) 100 MG tablet Take 1 tablet (100 mg total) by mouth 2 (two) times daily. 12/09/18   Irean HongSung, Sonnie Bias J, MD  ondansetron (ZOFRAN ODT) 4 MG disintegrating tablet Take 1 tablet (4 mg total) by mouth every 8 (eight) hours as needed for nausea or vomiting. 04/17/20   Irean HongSung, Vangie Henthorn J, MD  risperiDONE (RISPERDAL) 2 MG tablet Take 2 mg by mouth at bedtime. 05/21/19   [provider]    Allergies Shellfish allergy  Family History  Problem Relation Age of Onset  . Cholelithiasis Maternal Aunt   . Ulcers Maternal Grandmother   . Birth defects Maternal Grandmother   . Diabetes Maternal Grandmother   . Diabetes Maternal Grandfather   . Heart disease Maternal Grandfather   . Hyperlipidemia Maternal Grandfather   . Hypertension Maternal Grandfather   . Pancreatic cancer Maternal Grandfather   . Hypertension Mother   . Other Father        unsure of history    Social History Social History   Tobacco Use  . Smoking status: Former Smoker    Packs/day: 1.00    Types: Cigarettes  . Smokeless tobacco: Never Used  Vaping Use  . Vaping Use: Never used  Substance Use Topics  . Alcohol use: No  . Drug use: No    Review of Systems  Constitutional: No fever/chills Eyes: No visual changes.  ENT: No sore throat. Cardiovascular: Denies chest pain. Respiratory: Denies shortness of breath. Gastrointestinal: Positive for abdominal pain, nausea, vomiting and diarrhea.  No constipation. Genitourinary: Negative for dysuria. Musculoskeletal: Negative for back pain. Skin: Negative for rash. Neurological: Negative for headaches, focal weakness or numbness.   ____________________________________________   PHYSICAL EXAM:  VITAL SIGNS: ED Triage Vitals  Enc Vitals Group     BP 04/16/20 1920 113/65     Pulse Rate 04/16/20 1920 79     Resp 04/16/20  1920 16     Temp 04/16/20 1920 98.7 F (37.1 C)     Temp Source 04/16/20 1920 Oral     SpO2 04/16/20 1920 100 %     Weight 04/16/20 1921 150 lb (68 kg)     Height 04/16/20 1921 5\' 10"  (1.778 m)     Head Circumference --      Peak Flow --      Pain Score 04/16/20 1921 10     Pain Loc --      Pain Edu? --      Excl. in [place]? --     Constitutional: Alert and oriented. Well appearing and in no acute distress. Eyes: Conjunctivae are normal. PERRL. EOMI. Head: Atraumatic. Nose: No congestion/rhinnorhea. Mouth/Throat: Mucous membranes are mildly dry.   Neck: No stridor.   Cardiovascular: Normal rate, regular rhythm. Grossly normal heart sounds.  Good peripheral circulation. Respiratory: Normal respiratory effort.  No retractions. Lungs CTAB. Gastrointestinal: Soft and nontender to light or deep palpation. No distention. No abdominal bruits. No CVA tenderness. Musculoskeletal: No lower extremity tenderness nor edema.  No joint effusions. Neurologic:  Normal speech and language. No gross focal neurologic deficits are appreciated.  Skin:  Skin is warm, dry and intact. No rash noted. Psychiatric: Mood and affect are normal. Speech and behavior are normal.  ____________________________________________   LABS (all labs ordered are listed, but only abnormal results are displayed)  Labs Reviewed  COMPREHENSIVE METABOLIC PANEL - Abnormal; Notable for the following components:      Result Value   Glucose, Bld 114 (*)    All other components within normal limits  CBC - Abnormal; Notable for the following components:   WBC 23.8 (*)    RBC 5.84 (*)    MCV 73.8 (*)    MCH 23.5 (*)    All other components within normal limits  URINALYSIS, COMPLETE (UACMP) WITH MICROSCOPIC - Abnormal; Notable for the following components:   Color, Urine AMBER (*)    APPearance CLOUDY (*)    Specific Gravity, Urine 1.032 (*)    Ketones, ur 20 (*)    Protein, ur 30 (*)    All other components within normal  limits  CBC WITH DIFFERENTIAL/PLATELET - Abnormal; Notable for the following components:   WBC 23.5 (*)    RBC 5.84 (*)    MCV 74.7 (*)    MCH 22.9 (*)    Neutro Abs 21.0 (*)    Monocytes Absolute 1.3 (*)    Abs Immature Granulocytes 0.11 (*)    All other components within normal limits  C DIFFICILE QUICK SCREEN W PCR REFLEX  GASTROINTESTINAL PANEL BY PCR, STOOL (REPLACES STOOL CULTURE)  LIPASE, BLOOD  TROPONIN I (HIGH SENSITIVITY)  TROPONIN I (HIGH SENSITIVITY)  TROPONIN I (HIGH SENSITIVITY)   ____________________________________________  EKG  ED ECG REPORT I, Raiquan Chandler J, the attending physician, personally viewed and interpreted this ECG.   Date: 04/17/2020  EKG Time: 1918  Rate: 90  Rhythm: normal EKG,  normal sinus rhythm  Axis: Normal  Intervals:none  ST&T Change: Nonspecific  ____________________________________________  RADIOLOGY I, Holten Spano J, personally viewed and evaluated these images (plain radiographs) as part of my medical decision making, as well as reviewing the written report by the radiologist.  ED MD interpretation: Changes related to active diarrheal illness; no acute findings  Official radiology report(s): CT Abdomen Pelvis W Contrast  Result Date: 04/17/2020 CLINICAL DATA:  Initial evaluation for acute nausea, vomiting, leukocytosis. EXAM: CT ABDOMEN AND PELVIS WITH CONTRAST TECHNIQUE: Multidetector CT imaging of the abdomen and pelvis was performed using the standard protocol following bolus administration of intravenous contrast. CONTRAST:  OMNIPAQUE IOHEXOL 300 MG/ML  SOLN COMPARISON:  Prior CT from 08/09/2019. FINDINGS: Lower chest: Visualized lung bases are clear. Hepatobiliary: Liver demonstrates a normal contrast enhanced appearance. Gallbladder within normal limits. No biliary dilatation. Pancreas: Pancreas within normal limits. Spleen: Spleen within normal limits. Adrenals/Urinary Tract: Adrenal glands are normal. Six kidneys equal in  size with symmetric enhancement. No nephrolithiasis, hydronephrosis, or focal enhancing renal mass. No visible hydroureter. Partially distended bladder within normal limits. Stomach/Bowel: Stomach within normal limits. No evidence for bowel obstruction. Appendix within normal limits without evidence for acute appendicitis. Intraluminal fluid density with mild mucosal enhancement noted about the distal rectosigmoid colon, which could reflect changes related to active diarrheal illness (series 2, image 67). No other acute inflammatory changes seen about the bowels. Vascular/Lymphatic: Normal intravascular enhancement seen throughout the intra-abdominal aorta. Mesenteric vessels patent proximally. No adenopathy. Reproductive: Prostate within normal limits. Other: No free air or fluid. Musculoskeletal: No acute osseous abnormality. No discrete or worrisome osseous lesions. IMPRESSION: 1. Intraluminal fluid density with mild mucosal enhancement about the distal rectosigmoid colon, which could reflect changes related to active diarrheal illness. 2. No other acute intra-abdominal or pelvic process identified. Electronically Signed   By: Rise Mu M.D.   On: 04/17/2020 00:41    ____________________________________________   PROCEDURES  Procedure(s) performed (including Critical Care):  Procedures   ____________________________________________   INITIAL IMPRESSION / ASSESSMENT AND PLAN / ED COURSE  As part of my medical decision making, I reviewed the following data within the electronic MEDICAL RECORD NUMBER Nursing notes reviewed and incorporated, Labs reviewed, EKG interpreted, Old chart reviewed, Radiograph reviewed and Notes from prior ED visits     24 year old male presenting with abdominal cramps, N/V/D. Differential diagnosis includes, but is not limited to, acute appendicitis, renal colic, testicular torsion, urinary tract infection/pyelonephritis, prostatitis,  epididymitis,  diverticulitis, small bowel obstruction or ileus, colitis, abdominal aortic aneurysm, gastroenteritis, hernia, etc.  Laboratory results demonstrate leukocytosis, ketonuria.  CT abdomen/pelvis was performed which demonstrates active diarrheal illness; otherwise nothing acute.  2 sets of troponins negative.  Will initiate IV fluid resuscitation, IV antiemetic, IV Pepcid and reassess.  Stool studies ordered.   Clinical Course as of Apr 18 619  Tue Apr 17, 2020  0602 Patient feeling significantly better.  Tolerated ice chips without emesis.  No stool for collection.  Will discharge home with prescriptions for Bentyl and Zofran to use as needed.  Strict return precautions given.  Patient verbalizes understanding and agrees with plan of care.   [JS]    Clinical Course User Index [JS] Irean Hong, MD     ____________________________________________   FINAL CLINICAL IMPRESSION(S) / ED DIAGNOSES  Final diagnoses:  Nausea vomiting and diarrhea  Dehydration  Generalized abdominal pain     ED Discharge Orders         Ordered  dicyclomine (BENTYL) 20 MG tablet  Every 6 hours        04/17/20 0609    ondansetron (ZOFRAN ODT) 4 MG disintegrating tablet  Every 8 hours PRN        04/17/20 8546          *Please note:  Timothy Cole was evaluated in Emergency Department on 04/17/2020 for the symptoms described in the history of present illness. He was evaluated in the context of the global COVID-19 pandemic, which necessitated consideration that the patient might be at risk for infection with the SARS-CoV-2 virus that causes COVID-19. Institutional protocols and algorithms that pertain to the evaluation of patients at risk for COVID-19 are in a state of rapid change based on information released by regulatory bodies including the CDC and federal and state organizations. These policies and algorithms were followed during the patient's care in the ED.  Some ED evaluations and interventions may  be delayed as a result of limited staffing during and the pandemic.*   Note:  This document was prepared using Dragon voice recognition software and may include unintentional dictation errors.   Irean Hong, MD 04/17/20 [phone]

## 2020-04-17 NOTE — ED Notes (Signed)
Pt c/o 10/10 abdominal pain, EDP aware. See MAR for new orders.
